# Patient Record
Sex: Female | Born: 1935 | Race: Black or African American | Hispanic: No | Smoking: Never smoker
Health system: Southern US, Community
[De-identification: ages and names within clinical notes are randomized; demographics above are authoritative.]

## PROBLEM LIST (undated history)

## (undated) DIAGNOSIS — I340 Nonrheumatic mitral (valve) insufficiency: Secondary | ICD-10-CM

## (undated) DIAGNOSIS — I071 Rheumatic tricuspid insufficiency: Secondary | ICD-10-CM

## (undated) DIAGNOSIS — I482 Chronic atrial fibrillation, unspecified: Secondary | ICD-10-CM

## (undated) DIAGNOSIS — I5032 Chronic diastolic (congestive) heart failure: Secondary | ICD-10-CM

## (undated) DIAGNOSIS — H269 Unspecified cataract: Secondary | ICD-10-CM

## (undated) DIAGNOSIS — R7611 Nonspecific reaction to tuberculin skin test without active tuberculosis: Secondary | ICD-10-CM

## (undated) DIAGNOSIS — M1990 Unspecified osteoarthritis, unspecified site: Secondary | ICD-10-CM

## (undated) DIAGNOSIS — I10 Essential (primary) hypertension: Secondary | ICD-10-CM

## (undated) HISTORY — PX: NO PAST SURGERIES: SHX2092

## (undated) HISTORY — PX: CATARACT EXTRACTION: SUR2

## (undated) HISTORY — DX: Rheumatic tricuspid insufficiency: I07.1

## (undated) HISTORY — DX: Nonrheumatic mitral (valve) insufficiency: I34.0

## (undated) HISTORY — DX: Chronic diastolic (congestive) heart failure: I50.32

## (undated) HISTORY — DX: Unspecified osteoarthritis, unspecified site: M19.90

## (undated) HISTORY — PX: EYE SURGERY: SHX253

---

## 2001-02-06 ENCOUNTER — Emergency Department (HOSPITAL_COMMUNITY): Admission: EM | Admit: 2001-02-06 | Discharge: 2001-02-06 | Payer: Self-pay

## 2004-03-16 ENCOUNTER — Ambulatory Visit: Payer: Self-pay | Admitting: Family Medicine

## 2004-03-16 ENCOUNTER — Ambulatory Visit: Payer: Self-pay | Admitting: *Deleted

## 2004-05-25 ENCOUNTER — Ambulatory Visit: Payer: Self-pay | Admitting: Family Medicine

## 2004-06-01 ENCOUNTER — Ambulatory Visit: Payer: Self-pay | Admitting: Family Medicine

## 2004-06-24 ENCOUNTER — Ambulatory Visit: Payer: Self-pay | Admitting: Family Medicine

## 2004-07-01 ENCOUNTER — Ambulatory Visit (HOSPITAL_COMMUNITY): Admission: RE | Admit: 2004-07-01 | Discharge: 2004-07-01 | Payer: Self-pay | Admitting: Family Medicine

## 2004-07-15 ENCOUNTER — Ambulatory Visit: Payer: Self-pay | Admitting: Family Medicine

## 2004-08-09 ENCOUNTER — Ambulatory Visit (HOSPITAL_COMMUNITY): Admission: RE | Admit: 2004-08-09 | Discharge: 2004-08-09 | Payer: Self-pay | Admitting: Family Medicine

## 2004-11-02 ENCOUNTER — Ambulatory Visit (HOSPITAL_COMMUNITY): Admission: RE | Admit: 2004-11-02 | Discharge: 2004-11-02 | Payer: Self-pay | Admitting: Family Medicine

## 2004-11-25 ENCOUNTER — Ambulatory Visit: Payer: Self-pay | Admitting: Family Medicine

## 2005-02-09 ENCOUNTER — Ambulatory Visit: Payer: Self-pay | Admitting: Family Medicine

## 2005-05-24 ENCOUNTER — Ambulatory Visit: Payer: Self-pay | Admitting: Family Medicine

## 2005-07-14 ENCOUNTER — Ambulatory Visit (HOSPITAL_COMMUNITY): Admission: RE | Admit: 2005-07-14 | Discharge: 2005-07-14 | Payer: Self-pay | Admitting: Family Medicine

## 2005-11-22 ENCOUNTER — Ambulatory Visit: Payer: Self-pay | Admitting: Family Medicine

## 2005-11-24 ENCOUNTER — Ambulatory Visit: Payer: Self-pay | Admitting: Family Medicine

## 2005-11-24 ENCOUNTER — Ambulatory Visit (HOSPITAL_COMMUNITY): Admission: RE | Admit: 2005-11-24 | Discharge: 2005-11-24 | Payer: Self-pay | Admitting: Internal Medicine

## 2006-03-21 ENCOUNTER — Ambulatory Visit: Payer: Self-pay | Admitting: Family Medicine

## 2007-01-07 ENCOUNTER — Encounter (INDEPENDENT_AMBULATORY_CARE_PROVIDER_SITE_OTHER): Payer: Self-pay | Admitting: Family Medicine

## 2007-01-07 ENCOUNTER — Other Ambulatory Visit: Admission: RE | Admit: 2007-01-07 | Discharge: 2007-01-07 | Payer: Self-pay | Admitting: Family Medicine

## 2007-01-07 ENCOUNTER — Ambulatory Visit: Payer: Self-pay | Admitting: Family Medicine

## 2007-01-08 ENCOUNTER — Encounter (INDEPENDENT_AMBULATORY_CARE_PROVIDER_SITE_OTHER): Payer: Self-pay | Admitting: Family Medicine

## 2007-01-08 LAB — CONVERTED CEMR LAB
Albumin: 4.1 g/dL (ref 3.5–5.2)
Alkaline Phosphatase: 98 units/L (ref 39–117)
BUN: 18 mg/dL (ref 6–23)
Calcium: 8.9 mg/dL (ref 8.4–10.5)
Cholesterol: 229 mg/dL — ABNORMAL HIGH (ref 0–200)
Eosinophils Absolute: 0.2 10*3/uL (ref 0.0–0.7)
Eosinophils Relative: 3 % (ref 0–5)
Glucose, Bld: 96 mg/dL (ref 70–99)
HCT: 40.9 % (ref 36.0–46.0)
Lymphs Abs: 3.1 10*3/uL (ref 0.7–3.3)
MCHC: 33.7 g/dL (ref 30.0–36.0)
MCV: 94.9 fL (ref 78.0–100.0)
Monocytes Relative: 7 % (ref 3–11)
Platelets: 304 10*3/uL (ref 150–400)
TSH: 3.633 microintl units/mL (ref 0.350–5.50)
Total Bilirubin: 0.6 mg/dL (ref 0.3–1.2)
Total Protein: 8.1 g/dL (ref 6.0–8.3)
WBC: 7.1 10*3/uL (ref 4.0–10.5)

## 2007-01-11 ENCOUNTER — Ambulatory Visit (HOSPITAL_COMMUNITY): Admission: RE | Admit: 2007-01-11 | Discharge: 2007-01-11 | Payer: Self-pay | Admitting: Family Medicine

## 2007-01-17 ENCOUNTER — Encounter: Admission: RE | Admit: 2007-01-17 | Discharge: 2007-01-17 | Payer: Self-pay | Admitting: Family Medicine

## 2007-01-22 DIAGNOSIS — M949 Disorder of cartilage, unspecified: Secondary | ICD-10-CM

## 2007-01-22 DIAGNOSIS — I10 Essential (primary) hypertension: Secondary | ICD-10-CM | POA: Insufficient documentation

## 2007-01-22 DIAGNOSIS — K649 Unspecified hemorrhoids: Secondary | ICD-10-CM | POA: Insufficient documentation

## 2007-01-22 DIAGNOSIS — M899 Disorder of bone, unspecified: Secondary | ICD-10-CM | POA: Insufficient documentation

## 2007-01-22 DIAGNOSIS — E785 Hyperlipidemia, unspecified: Secondary | ICD-10-CM

## 2007-04-17 ENCOUNTER — Ambulatory Visit: Payer: Self-pay | Admitting: Family Medicine

## 2007-05-28 ENCOUNTER — Ambulatory Visit: Payer: Self-pay | Admitting: Family Medicine

## 2007-05-28 LAB — CONVERTED CEMR LAB
ALT: 14 units/L (ref 0–35)
Albumin: 4.3 g/dL (ref 3.5–5.2)
CO2: 25 meq/L (ref 19–32)
Chloride: 100 meq/L (ref 96–112)
Cholesterol: 179 mg/dL (ref 0–200)
Glucose, Bld: 94 mg/dL (ref 70–99)
Potassium: 3.7 meq/L (ref 3.5–5.3)
Sodium: 140 meq/L (ref 135–145)
Total Bilirubin: 0.6 mg/dL (ref 0.3–1.2)
Total Protein: 8.4 g/dL — ABNORMAL HIGH (ref 6.0–8.3)
Triglycerides: 126 mg/dL (ref <150)
VLDL: 25 mg/dL (ref 0–40)

## 2007-11-18 ENCOUNTER — Ambulatory Visit: Payer: Self-pay | Admitting: Family Medicine

## 2007-11-18 DIAGNOSIS — M159 Polyosteoarthritis, unspecified: Secondary | ICD-10-CM

## 2008-05-04 ENCOUNTER — Ambulatory Visit: Payer: Self-pay | Admitting: Family Medicine

## 2008-05-08 ENCOUNTER — Ambulatory Visit (HOSPITAL_COMMUNITY): Admission: RE | Admit: 2008-05-08 | Discharge: 2008-05-08 | Payer: Self-pay | Admitting: Family Medicine

## 2008-06-08 ENCOUNTER — Ambulatory Visit: Payer: Self-pay | Admitting: Family Medicine

## 2008-06-08 DIAGNOSIS — R51 Headache: Secondary | ICD-10-CM | POA: Insufficient documentation

## 2008-06-08 DIAGNOSIS — R519 Headache, unspecified: Secondary | ICD-10-CM | POA: Insufficient documentation

## 2008-06-08 DIAGNOSIS — M79609 Pain in unspecified limb: Secondary | ICD-10-CM | POA: Insufficient documentation

## 2008-06-08 LAB — CONVERTED CEMR LAB
BUN: 17 mg/dL (ref 6–23)
Basophils Absolute: 0 10*3/uL (ref 0.0–0.1)
Basophils Relative: 1 % (ref 0–1)
CO2: 25 meq/L (ref 19–32)
Calcium: 9.5 mg/dL (ref 8.4–10.5)
Chloride: 95 meq/L — ABNORMAL LOW (ref 96–112)
Creatinine, Ser: 1.1 mg/dL (ref 0.40–1.20)
Eosinophils Relative: 1 % (ref 0–5)
HCT: 40.4 % (ref 36.0–46.0)
Hemoglobin: 13.9 g/dL (ref 12.0–15.0)
Lymphocytes Relative: 41 % (ref 12–46)
MCHC: 34.4 g/dL (ref 30.0–36.0)
Monocytes Absolute: 0.5 10*3/uL (ref 0.1–1.0)
Monocytes Relative: 8 % (ref 3–12)
Neutro Abs: 3.4 10*3/uL (ref 1.7–7.7)
RBC: 4.37 M/uL (ref 3.87–5.11)
RDW: 14 % (ref 11.5–15.5)
TSH: 3.013 microintl units/mL (ref 0.350–4.50)
Total Bilirubin: 0.6 mg/dL (ref 0.3–1.2)

## 2008-08-11 ENCOUNTER — Other Ambulatory Visit: Admission: RE | Admit: 2008-08-11 | Discharge: 2008-08-11 | Payer: Self-pay | Admitting: Family Medicine

## 2008-08-11 ENCOUNTER — Ambulatory Visit: Payer: Self-pay | Admitting: Family Medicine

## 2008-08-11 ENCOUNTER — Encounter (INDEPENDENT_AMBULATORY_CARE_PROVIDER_SITE_OTHER): Payer: Self-pay | Admitting: Family Medicine

## 2008-08-11 LAB — CONVERTED CEMR LAB
HDL: 63 mg/dL (ref >39)
Total CHOL/HDL Ratio: 2.8
Triglycerides: 81 mg/dL (ref <150)

## 2008-08-14 ENCOUNTER — Encounter (INDEPENDENT_AMBULATORY_CARE_PROVIDER_SITE_OTHER): Payer: Self-pay | Admitting: *Deleted

## 2008-08-23 ENCOUNTER — Encounter (INDEPENDENT_AMBULATORY_CARE_PROVIDER_SITE_OTHER): Payer: Self-pay | Admitting: Family Medicine

## 2008-08-23 ENCOUNTER — Telehealth (INDEPENDENT_AMBULATORY_CARE_PROVIDER_SITE_OTHER): Payer: Self-pay | Admitting: Family Medicine

## 2008-09-18 ENCOUNTER — Encounter (INDEPENDENT_AMBULATORY_CARE_PROVIDER_SITE_OTHER): Payer: Self-pay | Admitting: Family Medicine

## 2008-10-23 ENCOUNTER — Encounter: Admission: RE | Admit: 2008-10-23 | Discharge: 2008-10-23 | Payer: Self-pay | Admitting: General Practice

## 2008-11-27 ENCOUNTER — Telehealth (INDEPENDENT_AMBULATORY_CARE_PROVIDER_SITE_OTHER): Payer: Self-pay | Admitting: Internal Medicine

## 2009-07-23 ENCOUNTER — Telehealth: Payer: Self-pay | Admitting: Physician Assistant

## 2009-08-17 ENCOUNTER — Telehealth: Payer: Self-pay | Admitting: Physician Assistant

## 2010-07-05 NOTE — Progress Notes (Signed)
  Phone Note Outgoing Call   Summary of Call: Rec'd request for Diovan refill. Is this still our patient? Says she has [employer]. If still our patient, needs f/u appt. Please send in Rx if still our patient. . . if not, she needs her current PCP to refill.  Initial call taken by: Brynda Rim,  July 23, 2009 8:52 AM  Follow-up for Phone Call        number is disconnected.Marland KitchenMarland KitchenMarland KitchenArmenia Shannon  July 23, 2009 9:02 AM   number is disconnected.Marland KitchenMarland KitchenMarland KitchenArmenia Shannon  July 29, 2009 5:17 PM   number is disconnected...will mail letter and hold rx up front until we hear something from patien.Mikey College CMA  July 30, 2009 11:03 AM     Prescriptions: DIOVAN HCT 160-25 MG  TABS (VALSARTAN-HYDROCHLOROTHIAZIDE) 1PO qAM  #30 x 1   Entered and Authorized by:   Tereso Newcomer PA-C   Signed by:   Tereso Newcomer PA-C on 07/23/2009   Method used:   Printed then faxed to ...       [facility]  [address] [phone]* (retail)       [address]       [place], [place]  [address]       Ph: [phone]       Fax: [phone]   RxID:   [id number]

## 2010-07-05 NOTE — Progress Notes (Signed)
  Phone Note Outgoing Call   Summary of Call: Rec'd another refill request. See phone note from 07/23/2009.  Initial call taken by: Tereso Newcomer PA-C,  August 17, 2009 5:33 PM  Follow-up for Phone Call        number is disconnected.Marland KitchenMarland KitchenMarland KitchenArmenia Shannon  August 18, 2009 9:14 AM   called pharmacy and asked pharmacist to have pt to call office for the refill to see if she is still one of our pts Follow-up by: [place] Shannon,  August 18, 2009 9:19 AM

## 2011-03-23 ENCOUNTER — Ambulatory Visit (HOSPITAL_COMMUNITY)
Admission: RE | Admit: 2011-03-23 | Discharge: 2011-03-23 | Disposition: A | Discharge status: Home / Self Care | Payer: Medicare Other | Source: Ambulatory Visit | Attending: Ophthalmology | Admitting: Ophthalmology

## 2011-03-23 ENCOUNTER — Other Ambulatory Visit (HOSPITAL_COMMUNITY): Payer: Self-pay | Admitting: Ophthalmology

## 2011-03-23 ENCOUNTER — Encounter (HOSPITAL_COMMUNITY)
Admission: RE | Admit: 2011-03-23 | Discharge: 2011-03-23 | Disposition: A | Discharge status: Home / Self Care | Payer: Medicare Other | Source: Ambulatory Visit | Attending: Ophthalmology | Admitting: Ophthalmology

## 2011-03-23 DIAGNOSIS — H269 Unspecified cataract: Secondary | ICD-10-CM

## 2011-03-23 DIAGNOSIS — I498 Other specified cardiac arrhythmias: Secondary | ICD-10-CM | POA: Insufficient documentation

## 2011-03-23 DIAGNOSIS — Z01811 Encounter for preprocedural respiratory examination: Secondary | ICD-10-CM | POA: Insufficient documentation

## 2011-03-23 DIAGNOSIS — Z01812 Encounter for preprocedural laboratory examination: Secondary | ICD-10-CM | POA: Insufficient documentation

## 2011-03-23 LAB — CBC
HCT: 39.5 % (ref 36.0–46.0)
Hemoglobin: 13.7 g/dL (ref 12.0–15.0)
MCH: 32.5 pg (ref 26.0–34.0)
MCV: 93.6 fL (ref 78.0–100.0)
Platelets: 215 10*3/uL (ref 150–400)
RBC: 4.22 MIL/uL (ref 3.87–5.11)
WBC: 6.5 10*3/uL (ref 4.0–10.5)

## 2011-03-23 LAB — BASIC METABOLIC PANEL
CO2: 26 mEq/L (ref 19–32)
Calcium: 9.4 mg/dL (ref 8.4–10.5)
Chloride: 95 mEq/L — ABNORMAL LOW (ref 96–112)
Potassium: 4 mEq/L (ref 3.5–5.1)
Sodium: 134 mEq/L — ABNORMAL LOW (ref 135–145)

## 2011-03-23 LAB — APTT: aPTT: 37 seconds (ref 24–37)

## 2011-03-29 ENCOUNTER — Ambulatory Visit (HOSPITAL_COMMUNITY)
Admission: RE | Admit: 2011-03-29 | Discharge: 2011-03-29 | Disposition: A | Discharge status: Home / Self Care | Payer: Medicare Other | Source: Ambulatory Visit | Attending: Ophthalmology | Admitting: Ophthalmology

## 2011-03-29 DIAGNOSIS — I509 Heart failure, unspecified: Secondary | ICD-10-CM | POA: Insufficient documentation

## 2011-03-29 DIAGNOSIS — H269 Unspecified cataract: Secondary | ICD-10-CM | POA: Insufficient documentation

## 2011-03-29 DIAGNOSIS — Z01818 Encounter for other preprocedural examination: Secondary | ICD-10-CM | POA: Insufficient documentation

## 2011-03-29 DIAGNOSIS — R062 Wheezing: Secondary | ICD-10-CM | POA: Insufficient documentation

## 2011-03-29 DIAGNOSIS — Z5309 Procedure and treatment not carried out because of other contraindication: Secondary | ICD-10-CM | POA: Insufficient documentation

## 2011-03-31 ENCOUNTER — Ambulatory Visit
Admission: RE | Admit: 2011-03-31 | Discharge: 2011-03-31 | Disposition: A | Discharge status: Home / Self Care | Payer: Medicare Other | Source: Ambulatory Visit | Attending: Cardiovascular Disease | Admitting: Cardiovascular Disease

## 2011-03-31 ENCOUNTER — Other Ambulatory Visit: Payer: Self-pay | Admitting: Cardiovascular Disease

## 2011-03-31 DIAGNOSIS — R0602 Shortness of breath: Secondary | ICD-10-CM

## 2012-03-27 ENCOUNTER — Ambulatory Visit
Admission: RE | Admit: 2012-03-27 | Discharge: 2012-03-27 | Disposition: A | Discharge status: Home / Self Care | Payer: Medicare Other | Source: Ambulatory Visit | Attending: Cardiovascular Disease | Admitting: Cardiovascular Disease

## 2012-03-27 ENCOUNTER — Other Ambulatory Visit: Payer: Self-pay | Admitting: Cardiovascular Disease

## 2012-03-27 DIAGNOSIS — R0602 Shortness of breath: Secondary | ICD-10-CM

## 2012-08-08 ENCOUNTER — Encounter (HOSPITAL_COMMUNITY): Payer: Self-pay

## 2012-08-13 ENCOUNTER — Encounter (HOSPITAL_COMMUNITY)
Admission: RE | Admit: 2012-08-13 | Discharge: 2012-08-13 | Disposition: A | Discharge status: Home / Self Care | Payer: Medicare Other | Source: Ambulatory Visit | Attending: Ophthalmology | Admitting: Ophthalmology

## 2012-08-13 ENCOUNTER — Encounter (HOSPITAL_COMMUNITY): Payer: Self-pay

## 2012-08-13 ENCOUNTER — Other Ambulatory Visit: Payer: Self-pay | Admitting: Ophthalmology

## 2012-08-13 HISTORY — DX: Essential (primary) hypertension: I10

## 2012-08-13 HISTORY — DX: Chronic atrial fibrillation, unspecified: I48.20

## 2012-08-13 HISTORY — DX: Unspecified cataract: H26.9

## 2012-08-13 HISTORY — DX: Nonspecific reaction to tuberculin skin test without active tuberculosis: R76.11

## 2012-08-13 LAB — BASIC METABOLIC PANEL
CO2: 24 mEq/L (ref 19–32)
GFR calc non Af Amer: 33 mL/min — ABNORMAL LOW (ref >90)
Glucose, Bld: 107 mg/dL — ABNORMAL HIGH (ref 70–99)
Potassium: 4.4 mEq/L (ref 3.5–5.1)
Sodium: 135 mEq/L (ref 135–145)

## 2012-08-13 LAB — APTT: aPTT: 38 seconds — ABNORMAL HIGH (ref 24–37)

## 2012-08-13 LAB — CBC
Hemoglobin: 14.7 g/dL (ref 12.0–15.0)
MCH: 34.1 pg — ABNORMAL HIGH (ref 26.0–34.0)
MCV: 94.4 fL (ref 78.0–100.0)
RBC: 4.31 MIL/uL (ref 3.87–5.11)
WBC: 8.9 10*3/uL (ref 4.0–10.5)

## 2012-08-13 LAB — PROTIME-INR: Prothrombin Time: 24.1 seconds — ABNORMAL HIGH (ref 11.6–15.2)

## 2012-08-13 MED ORDER — TETRACAINE HCL 0.5 % OP SOLN: 1.0000 [drp] | OPHTHALMIC | Status: DC

## 2012-08-13 NOTE — Pre-Procedure Instructions (Signed)
Kaytelynn Scripter  08/13/2012   Your procedure is scheduled on:  Wednesday August 14, 2012  Report to [facility] at 8:30 AM.  Call this number if you have problems the morning of surgery: [phone]   Remember:   Do not eat food or drink liquids after midnight.   Take these medicines the morning of surgery with A SIP OF WATER: tylenol (if needed), atenolol, tobradex, tekturna   Do not wear jewelry, make-up or nail polish.  Do not wear lotions, powders, or perfumes.  Do not shave 48 hours prior to surgery.  Do not bring valuables to the hospital.  Contacts, dentures or bridgework may not be worn into surgery.  Leave suitcase in the car. After surgery it may be brought to your room.  For patients admitted to the hospital, checkout time is 11:00 AM the day of  discharge.   Patients discharged the day of surgery will not be allowed to drive  home.  Name and phone number of your driver: family / friend  Special Instructions: Shower using CHG 2 nights before surgery and the night before surgery.  If you shower the day of surgery use CHG.  Use special wash - you have one bottle of CHG for all showers.  You should use approximately 1/3 of the bottle for each shower.   Please read over the following fact sheets that you were given: Pain Booklet, Coughing and Deep Breathing and Surgical Site Infection Prevention

## 2012-08-13 NOTE — Progress Notes (Signed)
Records received from [facility]; Pt with chronic AFIB and diastolic heart failure last seen 04/2012. Patents daughter states that Dr Harlon Flor told her not to stop Coumadin for surgery.Dr Harlon Flor not in office . Left message with receptionist to put orders in epic.

## 2012-08-13 NOTE — H&P (Signed)
History & Physical:   DATE:   08-01-2012  NAME:  Sherry Peck, Sherry Peck      [id number]       HISTORY OF PRESENT ILLNESS: Chief Eye Complaints blurry vision   Pre-op  phaco emulsion cataract extraction w/IOL OD.  patient  states she is ready for the surgery OD      HPI: EYES: Reports symptoms of vision disturbances.    can't read and trouble seeing to walk.     LOCATION:      QUALITY/COURSE:   Reports condition is worsening.        INTENSITY/SEVERITY:    Reports measurement ( or degree) as  severe   DURATION:   Reports the general length of symptoms to be months.      ONSET/TIMING:   Reports occurrence as 5 months ago.      CONTEXT/WHEN:   Reports usually associated with   MODIFIERS/TREATMENTS:  Improved by              ROS:   GEN- Constitutional: HENT: GEN - Endocrine: Reports symptoms of LUNGS/Respiratory:        HEART/Cardiovascular: Reports symptoms of hypertension.        A-Fib, ABD/Gastrointestinal:   Musculoskeletal (BJE):arthritis NEURO/Neurological:     PSYCH/Psychiatric:    Is the pt oriented to time, place, person?  Mood  normal    ACTIVE PROBLEMS: Mature senile cataract   ICD#366.17  OD  SURGERIES: AFTER CATARACT LASER SURGERY.    #09811  Related Dxs-  Modifiers-   YAG OS 03-28-12  Cataract removal,  OS Pick List - Surgeries  MEDICATIONS: "Klor-Con (Potassium) ER Tabs:    10 mEq (tablet, extended release)    SIG-   1 each      once a day               Tekturna (aliskiren):   150 mg (tablet) SIG-  1 tab(s)  once a day    Diltiazem (Cardizem, Dilacor, Tiazac):   120 mg (tablet)  SIG-  1 each    2 times a day     Coumadin (Warfarin):   3 mg (tablet)  SIG-   as directed   once a day    Furosemide (Lasix):   20 mg (tablet)  SIG-  1 each    in the AM     Atenolol (Tenormin):   25 mg (tablet)  SIG-  1 each   once a day  REVIEW OF SYSTEMS: not found  TOBACCO: Never smoker   ICD#V13.8   Pick List - Tobacco - Summary    Smoker Status:     Tobacco use:     Tobacco cessation:  SOCIAL HISTORY: Herbalist List - Social History  FAMILY HISTORY: Positive family history for  -   HTN UNCLES/AUNT  ALLERGIES: Drug Allergies:     No known.    Starter - Allergies - Summary:  PHYSICAL EXAMINATION: Exam: GENERAL: Appearance: General appearance can be described as well-nourished, well-developed, and in no acute distress.         [place]    OD [place] 20/  CF @ 2 1/2'    unable to undeerstand    cc 20/          OS [place] 20/    200     cc 20/  EYEGLASSES: OD  Didn't Bring  OS                ADD:  MR  OD    +-  NI     CF        OS    +- NI   20/200 pictures ADD  VF:  OD                                               OS  PUPILS: 3 mm reactive each eye  EYELIDS & OCULAR ADNEXA nevi each eye  SLE: Conjunctiva heavy pigmentation  Cornea arcus with decreased tear film each eye   anterior chamber  deep and quiet each eye  Iris Brown each eye with iris atrophy OS  Lens +3-4 nuclear and cortical opacity white cataract OD, posterior chamber intraocular lens implant OS with posterior capsule fibrosis  Vitreous  CCT  Ta   in mmHg  18  OD       18     OS Time 5:20 PM  Gonio   Dilation tropicamide 1% phenylephrine 2.5%  Fundus: poor view OU  optic nerve  OD                                                                     OS   Macula      OD                                                    OS   Vessels  Periphery  H&P  B/P: 130/62 Resp:20 Pulse:58   Exam: GENERAL: Appearance: General appearance can be described as well-nourished, well-developed, and in no acute distress.    LYMPHATIC: HEAD, EARS, NOSE AND THROAT: Ears-Nose (external) Inspection: Externally, nose and ears are normal in appearance and without scars, lesions, or nodules.      Otoscopic Exam: External auditory canals and tympanic membranes are normal.      Hearing assessment shows  no problems with normal conversation.    Nose exam, internally, reveals nasal mucosa, septum and turbinates are unremarkable.    Teeth, Gingiva, and Lip Exams: No lesions or evidence of infection.      Oropharynx demonstrates oral mucosa, salivary glands, tongue, tonsils, posterior pharynx, hard-soft palates are normal.  EYES: see above  NECK: Neck tissue exam demonstrates no masses, symmetrical, and trachea is midline.      LUNGS and RESPIRATORY: Lung auscultation elicits no wheezing, rhonci, rales or rubs and with equal breath sounds.    Respiratory effort described as breathing is unlabored and chest movement is symmetrical.    HEART (Cardiovascular): Heart auscultation discovers regular rate and rhythm; no murmur, gallop or rub. Normal heart sounds, murmur, gallop.    ABDOMEN (Gastrointestinal): Mass/Tenderness Exam: Neither are present.     Liver/Spleen: No hepatomegaly or splenomegaly.   MUSCULOSKELETAL (BJE): Inspection-Palpation: No major bone, joint, tendon, or muscle changes.      NEUROLOGICAL: Alert and oriented. No major deficits of  coordination or sensation.      PSYCHIATRIC: Insight and judgment appear  both to be intact and appropriate.    Mood and affect are described as normal mood and full affect.    SKIN: Skin Inspection: No rashes or lesions.  ADMITTING DIAGNOSIS: Mature senile cataract   ICD#366.17  OD COMPLEX WILL REQUIRE  TRYPAN BLUE STAIN ANTERIOR  CAPSULE  Risk and benefits of surgery have been reviewed with the patient and the patient agrees to proceed with the surgical procedure.  SURGICAL TREATMENT PLAN: ECCE OR  phaco emulsion cataract extraction  WITH  intraocular lens implant  OD   Actions:     Handouts: Calcium Channel Blockers, Coumadin, Erythromycins, Furosemide, Insect Bites/stings, Potassium, Beta-Blockers.    ___________________________ Chalmers Guest, Montez Hageman Starter - Inactive Problems:

## 2012-08-14 ENCOUNTER — Encounter (HOSPITAL_COMMUNITY): Payer: Self-pay | Admitting: *Deleted

## 2012-08-14 ENCOUNTER — Ambulatory Visit (HOSPITAL_COMMUNITY): Admission: RE | Admit: 2012-08-14 | Payer: Medicare Other | Source: Ambulatory Visit | Admitting: Ophthalmology

## 2012-08-14 ENCOUNTER — Encounter (HOSPITAL_COMMUNITY): Payer: Self-pay | Admitting: Anesthesiology

## 2012-08-14 ENCOUNTER — Ambulatory Visit (HOSPITAL_COMMUNITY)
Admission: RE | Admit: 2012-08-14 | Discharge: 2012-08-14 | Disposition: A | Discharge status: Home / Self Care | Payer: Medicare Other | Source: Ambulatory Visit | Attending: Ophthalmology | Admitting: Ophthalmology

## 2012-08-14 ENCOUNTER — Encounter (HOSPITAL_COMMUNITY): Admission: RE | Payer: Self-pay | Source: Ambulatory Visit

## 2012-08-14 ENCOUNTER — Ambulatory Visit (HOSPITAL_COMMUNITY): Payer: Medicare Other | Admitting: Anesthesiology

## 2012-08-14 ENCOUNTER — Encounter (HOSPITAL_COMMUNITY)
Admission: RE | Disposition: A | Discharge status: Home / Self Care | Payer: Self-pay | Source: Ambulatory Visit | Attending: Ophthalmology

## 2012-08-14 DIAGNOSIS — M129 Arthropathy, unspecified: Secondary | ICD-10-CM | POA: Insufficient documentation

## 2012-08-14 DIAGNOSIS — I10 Essential (primary) hypertension: Secondary | ICD-10-CM | POA: Insufficient documentation

## 2012-08-14 DIAGNOSIS — I4891 Unspecified atrial fibrillation: Secondary | ICD-10-CM | POA: Insufficient documentation

## 2012-08-14 DIAGNOSIS — IMO0002 Reserved for concepts with insufficient information to code with codable children: Secondary | ICD-10-CM | POA: Insufficient documentation

## 2012-08-14 HISTORY — PX: CATARACT EXTRACTION W/PHACO: SHX586

## 2012-08-14 SURGERY — PHACOEMULSIFICATION, CATARACT, WITH IOL INSERTION
Anesthesia: Monitor Anesthesia Care | Site: Eye | Laterality: Right | Wound class: Clean

## 2012-08-14 SURGERY — PHACOEMULSIFICATION, CATARACT, WITH IOL INSERTION
Anesthesia: Monitor Anesthesia Care | Laterality: Right

## 2012-08-14 MED ORDER — TOBRAMYCIN 0.3 % OP OINT
TOPICAL_OINTMENT | OPHTHALMIC | Status: DC | PRN
  Administered 2012-08-14: 1 via OPHTHALMIC

## 2012-08-14 MED ORDER — DEXAMETHASONE SODIUM PHOSPHATE 10 MG/ML IJ SOLN
INTRAMUSCULAR | Status: AC
  Filled 2012-08-14: qty 1

## 2012-08-14 MED ORDER — HYDROMORPHONE HCL PF 1 MG/ML IJ SOLN: 0.2500 mg | INTRAMUSCULAR | Status: DC | PRN

## 2012-08-14 MED ORDER — TOBRAMYCIN-DEXAMETHASONE 0.3-0.1 % OP OINT
TOPICAL_OINTMENT | OPHTHALMIC | Status: AC
  Filled 2012-08-14: qty 3.5

## 2012-08-14 MED ORDER — PROPOFOL 10 MG/ML IV BOLUS
INTRAVENOUS | Status: DC | PRN
  Administered 2012-08-14: 40 mg via INTRAVENOUS

## 2012-08-14 MED ORDER — GATIFLOXACIN 0.5 % OP SOLN
1.0000 [drp] | OPHTHALMIC | Status: AC
  Administered 2012-08-14 (×3): 1 [drp] via OPHTHALMIC

## 2012-08-14 MED ORDER — NA CHONDROIT SULF-NA HYALURON 40-30 MG/ML IO SOLN
INTRAOCULAR | Status: DC | PRN
  Administered 2012-08-14 (×2): 0.5 mL via INTRAOCULAR

## 2012-08-14 MED ORDER — ACETYLCHOLINE CHLORIDE 1:100 IO SOLR
INTRAOCULAR | Status: AC
  Filled 2012-08-14: qty 1

## 2012-08-14 MED ORDER — GENTAMICIN SULFATE 40 MG/ML IJ SOLN
INTRAMUSCULAR | Status: AC
  Filled 2012-08-14: qty 2

## 2012-08-14 MED ORDER — TRYPAN BLUE 0.15 % OP SOLN
OPHTHALMIC | Status: DC | PRN
  Administered 2012-08-14: 0.5 mL via INTRAVITREAL

## 2012-08-14 MED ORDER — TROPICAMIDE 1 % OP SOLN: 1.0000 [drp] | OPHTHALMIC | Status: DC

## 2012-08-14 MED ORDER — LIDOCAINE HCL 2 % IJ SOLN
INTRAMUSCULAR | Status: DC | PRN
  Administered 2012-08-14: 10 mL

## 2012-08-14 MED ORDER — BSS IO SOLN
INTRAOCULAR | Status: DC | PRN
  Administered 2012-08-14: 15 mL via INTRAOCULAR

## 2012-08-14 MED ORDER — KETOROLAC TROMETHAMINE 0.5 % OP SOLN
1.0000 [drp] | OPHTHALMIC | Status: DC
  Filled 2012-08-14: qty 5

## 2012-08-14 MED ORDER — KETOROLAC TROMETHAMINE 0.5 % OP SOLN
1.0000 [drp] | OPHTHALMIC | Status: AC
  Administered 2012-08-14: 1 [drp] via OPHTHALMIC

## 2012-08-14 MED ORDER — LIDOCAINE-EPINEPHRINE 2 %-1:100000 IJ SOLN
INTRAMUSCULAR | Status: AC
  Filled 2012-08-14: qty 1

## 2012-08-14 MED ORDER — GATIFLOXACIN 0.5 % OP SOLN
1.0000 [drp] | OPHTHALMIC | Status: DC
  Filled 2012-08-14: qty 2.5

## 2012-08-14 MED ORDER — TROPICAMIDE 1 % OP SOLN
1.0000 [drp] | OPHTHALMIC | Status: DC
  Administered 2012-08-14: 1 [drp] via OPHTHALMIC
  Filled 2012-08-14: qty 3

## 2012-08-14 MED ORDER — BUPIVACAINE HCL (PF) 0.75 % IJ SOLN
INTRAMUSCULAR | Status: AC
  Filled 2012-08-14: qty 10

## 2012-08-14 MED ORDER — SODIUM HYALURONATE 10 MG/ML IO SOLN
INTRAOCULAR | Status: AC
  Filled 2012-08-14: qty 0.85

## 2012-08-14 MED ORDER — TRYPAN BLUE 0.15 % OP SOLN
0.5000 mL | OPHTHALMIC | Status: DC
  Filled 2012-08-14: qty 0.5

## 2012-08-14 MED ORDER — CYCLOPENTOLATE HCL 1 % OP SOLN
1.0000 [drp] | OPHTHALMIC | Status: DC
  Administered 2012-08-14: 1 [drp] via OPHTHALMIC
  Filled 2012-08-14: qty 2

## 2012-08-14 MED ORDER — TETRACAINE HCL 0.5 % OP SOLN
OPHTHALMIC | Status: AC
  Filled 2012-08-14: qty 2

## 2012-08-14 MED ORDER — LIDOCAINE HCL 2 % IJ SOLN
INTRAMUSCULAR | Status: AC
  Filled 2012-08-14: qty 20

## 2012-08-14 MED ORDER — BUPIVACAINE-EPINEPHRINE 0.25% -1:200000 IJ SOLN
INTRAMUSCULAR | Status: AC
  Filled 2012-08-14: qty 1

## 2012-08-14 MED ORDER — BSS IO SOLN
INTRAOCULAR | Status: AC
  Filled 2012-08-14: qty 500

## 2012-08-14 MED ORDER — TETRACAINE HCL 0.5 % OP SOLN
OPHTHALMIC | Status: DC | PRN
  Administered 2012-08-14: 4 [drp] via OPHTHALMIC

## 2012-08-14 MED ORDER — LIDOCAINE HCL (CARDIAC) 20 MG/ML IV SOLN
INTRAVENOUS | Status: DC | PRN
  Administered 2012-08-14: 60 mg via INTRAVENOUS

## 2012-08-14 MED ORDER — EPINEPHRINE HCL 1 MG/ML IJ SOLN
INTRAMUSCULAR | Status: AC
  Filled 2012-08-14: qty 1

## 2012-08-14 MED ORDER — ACETYLCHOLINE CHLORIDE 1:100 IO SOLR
INTRAOCULAR | Status: DC | PRN
  Administered 2012-08-14: 20 mg via INTRAOCULAR

## 2012-08-14 MED ORDER — PHENYLEPHRINE HCL 2.5 % OP SOLN: 1.0000 [drp] | OPHTHALMIC | Status: DC

## 2012-08-14 MED ORDER — BUPIVACAINE HCL (PF) 0.75 % IJ SOLN
INTRAMUSCULAR | Status: DC | PRN
  Administered 2012-08-14: 10 mL

## 2012-08-14 MED ORDER — PROVISC 10 MG/ML IO SOLN
INTRAOCULAR | Status: DC | PRN
  Administered 2012-08-14: 8.5 mg via INTRAOCULAR

## 2012-08-14 MED ORDER — FENTANYL CITRATE 0.05 MG/ML IJ SOLN
INTRAMUSCULAR | Status: DC | PRN
  Administered 2012-08-14: 50 ug via INTRAVENOUS

## 2012-08-14 MED ORDER — ONDANSETRON HCL 4 MG/2ML IJ SOLN: 4.0000 mg | Freq: Once | INTRAMUSCULAR | Status: DC | PRN

## 2012-08-14 MED ORDER — PILOCARPINE HCL 4 % OP SOLN
OPHTHALMIC | Status: AC
  Filled 2012-08-14: qty 15

## 2012-08-14 MED ORDER — SODIUM CHLORIDE 0.9 % IV SOLN
INTRAVENOUS | Status: DC
  Administered 2012-08-14: 10:00:00 via INTRAVENOUS

## 2012-08-14 MED ORDER — PHENYLEPHRINE HCL 2.5 % OP SOLN
1.0000 [drp] | OPHTHALMIC | Status: DC
  Administered 2012-08-14: 1 [drp] via OPHTHALMIC
  Filled 2012-08-14: qty 3

## 2012-08-14 MED ORDER — EPINEPHRINE HCL 1 MG/ML IJ SOLN
INTRAOCULAR | Status: DC | PRN
  Administered 2012-08-14: 12:00:00

## 2012-08-14 MED ORDER — BSS IO SOLN
INTRAOCULAR | Status: AC
  Filled 2012-08-14: qty 15

## 2012-08-14 SURGICAL SUPPLY — 42 items
APPLICATOR COTTON TIP 6IN STRL (MISCELLANEOUS) ×2 IMPLANT
APPLICATOR DR MATTHEWS STRL (MISCELLANEOUS) ×2 IMPLANT
BLADE KERATOME 2.75 (BLADE) ×2 IMPLANT
BLADE MINI RND TIP GREEN BEAV (BLADE) IMPLANT
BLADE STAB KNIFE 45DEG (BLADE) IMPLANT
CANNULA ANTERIOR CHAMBER 27GA (MISCELLANEOUS) ×2 IMPLANT
CLOTH BEACON ORANGE TIMEOUT ST (SAFETY) ×2 IMPLANT
CORDS BIPOLAR (ELECTRODE) ×2 IMPLANT
DRAPE OPHTHALMIC 40X48 W POUCH (DRAPES) ×2 IMPLANT
DRAPE RETRACTOR (MISCELLANEOUS) ×2 IMPLANT
ERASER HMR WETFIELD 23G BP (MISCELLANEOUS) ×2 IMPLANT
FILTER BLUE MILLIPORE (MISCELLANEOUS) IMPLANT
GLOVE BIO SURGEON STRL SZ8 (GLOVE) ×2 IMPLANT
GLOVE BIOGEL M STRL SZ7.5 (GLOVE) ×2 IMPLANT
GOWN STRL NON-REIN LRG LVL3 (GOWN DISPOSABLE) ×4 IMPLANT
KIT BASIN OR (CUSTOM PROCEDURE TRAY) ×2 IMPLANT
KIT ROOM TURNOVER OR (KITS) ×2 IMPLANT
KNIFE CRESCENT 2.5 55 ANG (BLADE) ×2 IMPLANT
LENS IOL ACRSF IQ PC 24.5 (Intraocular Lens) ×1 IMPLANT
LENS IOL ACRYSOF IQ POST 24.5 (Intraocular Lens) ×2 IMPLANT
MASK EYE SHIELD (GAUZE/BANDAGES/DRESSINGS) ×2 IMPLANT
NEEDLE 18GX1X1/2 (RX/OR ONLY) (NEEDLE) ×2 IMPLANT
NEEDLE 25GX 5/8IN NON SAFETY (NEEDLE) ×2 IMPLANT
NEEDLE FILTER BLUNT 18X 1/2SAF (NEEDLE) ×1
NEEDLE FILTER BLUNT 18X1 1/2 (NEEDLE) ×1 IMPLANT
NS IRRIG 1000ML POUR BTL (IV SOLUTION) ×2 IMPLANT
PACK CATARACT CUSTOM (CUSTOM PROCEDURE TRAY) ×2 IMPLANT
PACK CATARACT MCHSCP (PACKS) ×2 IMPLANT
PAD ARMBOARD 7.5X6 YLW CONV (MISCELLANEOUS) ×4 IMPLANT
PAD EYE OVAL STERILE LF (GAUZE/BANDAGES/DRESSINGS) ×2 IMPLANT
PROBE ANTERIOR 20G W/INFUS NDL (MISCELLANEOUS) IMPLANT
SPEAR EYE SURG WECK-CEL (MISCELLANEOUS) ×4 IMPLANT
SUT ETHILON 10 0 CS140 6 (SUTURE) ×2 IMPLANT
SUT SILK 4 0 C 3 735G (SUTURE) IMPLANT
SUT SILK 6 0 G 6 (SUTURE) IMPLANT
SUT VICRYL 8 0 TG140 8 (SUTURE) IMPLANT
SYR 3ML LL SCALE MARK (SYRINGE) IMPLANT
SYR TB 1ML LUER SLIP (SYRINGE) ×2 IMPLANT
TAPE SURG TRANSPORE 1 IN (GAUZE/BANDAGES/DRESSINGS) ×1 IMPLANT
TAPE SURGICAL TRANSPORE 1 IN (GAUZE/BANDAGES/DRESSINGS) ×1
TOWEL OR 17X24 6PK STRL BLUE (TOWEL DISPOSABLE) ×4 IMPLANT
WATER STERILE IRR 1000ML POUR (IV SOLUTION) ×2 IMPLANT

## 2012-08-14 NOTE — Op Note (Signed)
Operative diagnosis mature complex cataract right eye Postoperative diagnosis: Same Procedure phacoemulsification with intraocular lens implant using trypan blue additional device because of the complex white cataract Anesthesia: 2% Xylocaine with epinephrine in a 50-50 mixture 0.75% Marcaine with ample Wydase Consultations: None Assistant: Mindy Procedure: The patient was given a peribulbar block with the aforementioned local anesthetic agent following this the patient's face was prepped and draped in the usual sterile fashion with a surgeon sitting at 12:00 the operating microscope was put into position and a lid speculum was inserted it was noted that the cataract was densely white making visualization of the anterior capsule and possible with out using trypan blue. Following this a Weck-Cel sponges used to fixate the globe and a stab incision was made at the into the anterior chamber was made at the 1:00 position following this using a Hoskins a 0.12 forceps the conjunctiva incision was made at the 11:00 position and was the conjunctiva was recessed forming a fornix-based conjunctival flap bleeding was controlled with cautery. All in this the 2.4 mm keratome blade was used in a stepwise fashion after creating a scleral flap and then the blade into the anterior chamber. At this point the trypan blue was then injected to stain the anterior capsule to improve visualization. Following this Viscoat was injected into the eye the Tytan blue was irrigated out using balanced salt solution. Following this a bent 25-gauge needle was used to incise anterior capsule and a continuous tear curvilinear capsulorrhexis was formed the Utrata forceps was used to remove the anterior capsule BSS was used to hydrodissect the nucleus the nucleus was slightly rotated in the capsular bag at this point the phacoemulsification unit was then used to sculpt and sculpt the very very dense nucleus 4 quadrants were sculpted and then the  nucleus was divided into 3 quadrants to quadrants were removed and the final fragment was Viscoat elevated and Viscoat separated with Viscoat and then removed with the phacoemulsification unit at this point the I/A was then used to remove a small remaining epinuclear flap there was almost no cortex in this mature cataract. Following this the intraocular lens implant was taken from the package and examined and noted to have no defects it was an Alcon AcrySof SN 60 WF 24.5 diopter lens SN #[id number] the lens is placed in the lens injector and after Provisc had been injected in the I/A was injected in position with a Kuglen hook beneath the anterior capsular leaf leaf the irrigation aspiration device was then used to remove Viscoat from the eye the eye was pressurized and there was no leakage at the incision the paracentesis tract was hydrated there being no leakage mock stat was injected in the eye the pupil was noted to come down round and symmetrical. All instruments removed from the eye topical TobraDex was applied TobraDex ointment a patch and Fox U. were placed and the patient returned to recovery area in stable condition. Complications: None Chalmers Guest Junior M.D.

## 2012-08-14 NOTE — Anesthesia Postprocedure Evaluation (Signed)
  Anesthesia Post-op Note  Patient: Sherry Peck  Procedure(s) Performed: Procedure(s): CATARACT EXTRACTION PHACO AND INTRAOCULAR LENS PLACEMENT (IOC) (Right)  Patient Location: PACU  Anesthesia Type:MAC  Level of Consciousness: awake, oriented and patient cooperative  Airway and Oxygen Therapy: Patient Spontanous Breathing  Post-op Pain: none  Post-op Assessment: Post-op Vital signs reviewed, Patient's Cardiovascular Status Stable, Respiratory Function Stable, Patent Airway, No signs of Nausea or vomiting and Pain level controlled  Post-op Vital Signs: stable  Complications: No apparent anesthesia complications

## 2012-08-14 NOTE — Progress Notes (Signed)
Report given to MArk RN

## 2012-08-14 NOTE — Anesthesia Preprocedure Evaluation (Signed)
Anesthesia Evaluation  Patient identified by MRN, date of birth, ID band Patient unresponsive    Reviewed: Allergy & Precautions, H&P , NPO status , Patient's Chart, lab work & pertinent test results  Airway Mallampati: I TM Distance: >3 FB Neck ROM: full    Dental   Pulmonary          Cardiovascular hypertension, +CHF Rhythm:regular Rate:Normal     Neuro/Psych  Headaches,    GI/Hepatic   Endo/Other    Renal/GU      Musculoskeletal   Abdominal   Peds  Hematology   Anesthesia Other Findings   Reproductive/Obstetrics                           Anesthesia Physical Anesthesia Plan  ASA: III  Anesthesia Plan: MAC and General   Post-op Pain Management:    Induction: Intravenous  Airway Management Planned: Mask  Additional Equipment:   Intra-op Plan:   Post-operative Plan:   Informed Consent: I have reviewed the patients History and Physical, chart, labs and discussed the procedure including the risks, benefits and alternatives for the proposed anesthesia with the patient or authorized representative who has indicated his/her understanding and acceptance.     Plan Discussed with: CRNA, Anesthesiologist and Surgeon  Anesthesia Plan Comments:         Anesthesia Quick Evaluation

## 2012-08-14 NOTE — H&P (View-Only) (Signed)
                  History & Physical:   DATE:   08-01-2012  NAME:  Sherry Peck, Sherry Peck      [id number]       HISTORY OF PRESENT ILLNESS: Chief Eye Complaints blurry vision   Pre-op  phaco emulsion cataract extraction w/IOL OD.  patient  states she is ready for the surgery OD      HPI: EYES: Reports symptoms of vision disturbances.    can't read and trouble seeing to walk.     LOCATION:      QUALITY/COURSE:   Reports condition is worsening.        INTENSITY/SEVERITY:    Reports measurement ( or degree) as  severe   DURATION:   Reports the general length of symptoms to be months.      ONSET/TIMING:   Reports occurrence as 5 months ago.      CONTEXT/WHEN:   Reports usually associated with   MODIFIERS/TREATMENTS:  Improved by              ROS:   GEN- Constitutional: HENT: GEN - Endocrine: Reports symptoms of LUNGS/Respiratory:        HEART/Cardiovascular: Reports symptoms of hypertension.        A-Fib, ABD/Gastrointestinal:   Musculoskeletal (BJE):arthritis NEURO/Neurological:     PSYCH/Psychiatric:    Is the pt oriented to time, place, person?  Mood  normal    ACTIVE PROBLEMS: Mature senile cataract   ICD#366.17  OD  SURGERIES: AFTER CATARACT LASER SURGERY.    #66821  Related Dxs-  Modifiers-   YAG OS 03-28-12  Cataract removal,  OS Pick List - Surgeries  MEDICATIONS: "Klor-Con (Potassium) ER Tabs:    10 mEq (tablet, extended release)    SIG-   1 each      once a day               Tekturna (aliskiren):   150 mg (tablet) SIG-  1 tab(s)  once a day    Diltiazem (Cardizem, Dilacor, Tiazac):   120 mg (tablet)  SIG-  1 each    2 times a day     Coumadin (Warfarin):   3 mg (tablet)  SIG-   as directed   once a day    Furosemide (Lasix):   20 mg (tablet)  SIG-  1 each    in the AM     Atenolol (Tenormin):   25 mg (tablet)  SIG-  1 each   once a day  REVIEW OF SYSTEMS: not found  TOBACCO: Never smoker   ICD#V13.8   Pick List - Tobacco - Summary    Smoker Status:     Tobacco use:     Tobacco cessation:  SOCIAL HISTORY: Starter Pick List - Social History  FAMILY HISTORY: Positive family history for  -   HTN UNCLES/AUNT  ALLERGIES: Drug Allergies:     No known.    Starter - Allergies - Summary:  PHYSICAL EXAMINATION: Exam: GENERAL: Appearance: General appearance can be described as well-nourished, well-developed, and in no acute distress.         [place]    OD [place] 20/  CF @ 2 1/2'    unable to undeerstand    cc 20/          OS [place] 20/    200     cc 20/  EYEGLASSES: OD  Didn't Bring                                                                      OS                ADD:  MR  OD    +-  NI     CF        OS    +- NI   20/200 pictures ADD  VF:  OD                                               OS  PUPILS: 3 mm reactive each eye  EYELIDS & OCULAR ADNEXA nevi each eye  SLE: Conjunctiva heavy pigmentation  Cornea arcus with decreased tear film each eye   anterior chamber  deep and quiet each eye  Iris Brown each eye with iris atrophy OS  Lens +3-4 nuclear and cortical opacity white cataract OD, posterior chamber intraocular lens implant OS with posterior capsule fibrosis  Vitreous  CCT  Ta   in mmHg  18  OD       18     OS Time 5:20 PM  Gonio   Dilation tropicamide 1% phenylephrine 2.5%  Fundus: poor view OU  optic nerve  OD                                                                     OS   Macula      OD                                                    OS   Vessels  Periphery  H&P  B/P: 130/62 Resp:20 Pulse:58   Exam: GENERAL: Appearance: General appearance can be described as well-nourished, well-developed, and in no acute distress.    LYMPHATIC: HEAD, EARS, NOSE AND THROAT: Ears-Nose (external) Inspection: Externally, nose and ears are normal in appearance and without scars, lesions, or nodules.      Otoscopic Exam: External auditory canals and tympanic membranes are normal.      Hearing assessment shows  no problems with normal conversation.    Nose exam, internally, reveals nasal mucosa, septum and turbinates are unremarkable.    Teeth, Gingiva, and Lip Exams: No lesions or evidence of infection.      Oropharynx demonstrates oral mucosa, salivary glands, tongue, tonsils, posterior pharynx, hard-soft palates are normal.  EYES: see above  NECK: Neck tissue exam demonstrates no masses, symmetrical, and trachea is midline.      LUNGS and RESPIRATORY: Lung auscultation elicits no wheezing, rhonci, rales or rubs and with equal breath sounds.    Respiratory effort described as breathing is unlabored and chest movement is symmetrical.    HEART (Cardiovascular): Heart auscultation discovers regular rate and rhythm; no murmur, gallop or rub. Normal heart sounds, murmur, gallop.    ABDOMEN (Gastrointestinal): Mass/Tenderness Exam: Neither are present.     Liver/Spleen: No hepatomegaly or splenomegaly.   MUSCULOSKELETAL (BJE): Inspection-Palpation: No major bone, joint, tendon, or muscle changes.      NEUROLOGICAL: Alert and oriented. No major deficits of   coordination or sensation.      PSYCHIATRIC: Insight and judgment appear  both to be intact and appropriate.    Mood and affect are described as normal mood and full affect.    SKIN: Skin Inspection: No rashes or lesions.  ADMITTING DIAGNOSIS: Mature senile cataract   ICD#366.17  OD COMPLEX WILL REQUIRE  TRYPAN BLUE STAIN ANTERIOR  CAPSULE  Risk and benefits of surgery have been reviewed with the patient and the patient agrees to proceed with the surgical procedure.  SURGICAL TREATMENT PLAN: ECCE OR  phaco emulsion cataract extraction  WITH  intraocular lens implant  OD   Actions:     Handouts: Calcium Channel Blockers, Coumadin, Erythromycins, Furosemide, Insect Bites/stings, Potassium, Beta-Blockers.    ___________________________ Roy Whitaker, Jr. Starter - Inactive Problems:  

## 2012-08-14 NOTE — Interval H&P Note (Signed)
History and Physical Interval Note:  08/14/2012 10:53 AM  Haze Rushing  has presented today for surgery, with the diagnosis of SENILE MATURE CATARACT RIGHT EYE  The various methods of treatment have been discussed with the patient and family. After consideration of risks, benefits and other options for treatment, the patient has consented to  Procedure(s): CATARACT EXTRACTION PHACO AND INTRAOCULAR LENS PLACEMENT (IOC) (Right) as a surgical intervention .  The patient's history has been reviewed, patient examined, no change in status, stable for surgery.  I have reviewed the patient's chart and labs.  Questions were answered to the patient's satisfaction.     WHITAKER, ROY

## 2012-08-14 NOTE — Preoperative (Signed)
Beta Blockers   Reason not to administer Beta Blockers:Not Applicable 

## 2012-08-14 NOTE — Transfer of Care (Signed)
Immediate Anesthesia Transfer of Care Note  Patient: Sherry Peck  Procedure(s) Performed: Procedure(s): CATARACT EXTRACTION PHACO AND INTRAOCULAR LENS PLACEMENT (IOC) (Right)  Patient Location: PACU  Anesthesia Type:MAC  Level of Consciousness: awake, alert , oriented and sedated  Airway & Oxygen Therapy: Patient Spontanous Breathing and Patient connected to nasal cannula oxygen  Post-op Assessment: Report given to PACU RN, Post -op Vital signs reviewed and stable and Patient moving all extremities  Post vital signs: Reviewed and stable  Complications: No apparent anesthesia complications

## 2012-08-17 ENCOUNTER — Encounter (HOSPITAL_COMMUNITY): Payer: Self-pay | Admitting: Ophthalmology

## 2012-08-21 ENCOUNTER — Ambulatory Visit: Payer: Self-pay | Admitting: Cardiovascular Disease

## 2012-08-21 DIAGNOSIS — I4891 Unspecified atrial fibrillation: Secondary | ICD-10-CM

## 2012-08-21 DIAGNOSIS — Z7901 Long term (current) use of anticoagulants: Secondary | ICD-10-CM | POA: Insufficient documentation

## 2012-11-15 ENCOUNTER — Ambulatory Visit (INDEPENDENT_AMBULATORY_CARE_PROVIDER_SITE_OTHER): Payer: Medicare Other | Admitting: Cardiovascular Disease

## 2012-11-15 ENCOUNTER — Encounter: Payer: Self-pay | Admitting: Cardiovascular Disease

## 2012-11-15 VITALS — BP 118/54 | HR 51 | Resp 20 | Ht 60.0 in | Wt 141.5 lb

## 2012-11-15 DIAGNOSIS — I509 Heart failure, unspecified: Secondary | ICD-10-CM

## 2012-11-15 DIAGNOSIS — Z79899 Other long term (current) drug therapy: Secondary | ICD-10-CM

## 2012-11-15 DIAGNOSIS — E785 Hyperlipidemia, unspecified: Secondary | ICD-10-CM

## 2012-11-15 DIAGNOSIS — I5032 Chronic diastolic (congestive) heart failure: Secondary | ICD-10-CM

## 2012-11-15 DIAGNOSIS — I10 Essential (primary) hypertension: Secondary | ICD-10-CM

## 2012-11-15 DIAGNOSIS — I110 Hypertensive heart disease with heart failure: Secondary | ICD-10-CM

## 2012-11-15 DIAGNOSIS — I4891 Unspecified atrial fibrillation: Secondary | ICD-10-CM

## 2012-11-15 NOTE — Patient Instructions (Addendum)
Please make next INR appointment in 4-6 weeks.  Your physician recommends that you schedule a follow-up appointment in: 6 months

## 2012-11-16 DIAGNOSIS — I5032 Chronic diastolic (congestive) heart failure: Secondary | ICD-10-CM | POA: Insufficient documentation

## 2012-11-16 DIAGNOSIS — I119 Hypertensive heart disease without heart failure: Secondary | ICD-10-CM | POA: Insufficient documentation

## 2012-11-16 LAB — BASIC METABOLIC PANEL
Calcium: 8.6 mg/dL (ref 8.4–10.5)
Creat: 1.54 mg/dL — ABNORMAL HIGH (ref 0.50–1.10)

## 2012-11-17 NOTE — Assessment & Plan Note (Signed)
Rate control is excellent, possibly slightly excessive. Her beta blocker and calcium channel blocker have been necessary for ventricular rate control as well as blood pressure control. No changes made to the current set of medications she is on appropriate warfarin anticoagulation with therapeutic levels

## 2012-11-17 NOTE — Progress Notes (Signed)
Patient ID: Sherry Peck, female   DOB: 1927-11-03, 79 y.o.   MRN: [id number]     Reason for office visit CHF, atrial fibrillation, hypertension  The patient returns accompanied by her daughter who has always versus transmitter. She is originally from [place]. She wants to visit home but unfortunately the airline tickets her to expense of this year. Overall she feels well. She denies shortness of breath and has no longer been troubled by lower showed edema.    Allergies  Allergen Reactions  . Ace Inhibitors     REACTION: cough(2002)    Current Outpatient Prescriptions  Medication Sig Dispense Refill  . acetaminophen (TYLENOL) 500 MG tablet Take 1,000 mg by mouth 2 (two) times daily as needed for pain.      Marland Kitchen aliskiren (TEKTURNA) 300 MG tablet Take 300 mg by mouth daily.      Marland Kitchen atenolol (TENORMIN) 25 MG tablet Take 12.5 mg by mouth daily.      . Calcium Carbonate-Vitamin D (CALCIUM 600/VITAMIN D PO) Take 2 tablets by mouth daily.      Marland Kitchen diltiazem (TIAZAC) 360 MG 24 hr capsule Take 360 mg by mouth daily.      . DUREZOL 0.05 % EMUL Place 1 drop into both eyes daily.      . fish oil-omega-3 fatty acids 1000 MG capsule Take 2,000 mg by mouth daily.      Marland Kitchen PROLENSA 0.07 % SOLN Place 1 drop into both eyes daily.       Marland Kitchen warfarin (COUMADIN) 5 MG tablet Take 2.5-5 mg by mouth daily. 2.5mg  every day except 5mg  on Mondays       No current facility-administered medications for this visit.    Past Medical History  Diagnosis Date  . Hypertension   . 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 Cataract     right eye  . Dysrhythmia   . Chronic a-fib     sees Dr Royann Shivers @ [facility]  . Cardiomyopathy due to hypertension     stable  . CHF (congestive heart failure)   . Diastolic heart failure     Past Surgical History  Procedure Laterality Date  . No past surgeries    . Eye surgery    . Cataract extraction Left     in [place]  . Cataract extraction w/phaco Right 08/14/2012   Procedure: CATARACT EXTRACTION PHACO AND INTRAOCULAR LENS PLACEMENT (IOC);  Surgeon: Chalmers Guest, MD;  Location: [facility];  Service: Ophthalmology;  Laterality: Right;    No family history on file.  History   Social History  . Marital Status: Single    Spouse Name: N/A    Number of Children: N/A  . Years of Education: N/A   Occupational History  . Not on file.   Social History Main Topics  . Smoking status: Never Smoker   . Smokeless tobacco: Never Used  . Alcohol Use: No  . Drug Use: No  . Sexually Active: No   Other Topics Concern  . Not on file   Social History Narrative  . No narrative on file    Review of systems: The patient specifically denies any chest pain at rest or with exertion, dyspnea at rest or with exertion, orthopnea, paroxysmal nocturnal dyspnea, syncope, palpitations, focal neurological deficits, intermittent claudication, lower extremity edema, unexplained weight gain, cough, hemoptysis or wheezing.  The patient also denies abdominal pain, nausea, vomiting, dysphagia, diarrhea, constipation, polyuria, polydipsia, dysuria, hematuria, frequency, urgency,  abnormal bleeding or bruising, fever, chills, unexpected weight changes, mood swings, change in skin or hair texture, change in voice quality, auditory or visual problems, allergic reactions or rashes, new musculoskeletal complaints other than usual "aches and pains".   PHYSICAL EXAM BP 118/54  Pulse 51  Resp 20  Ht 5' (1.524 m)  Wt 141 lb 8 oz (64.184 kg)  BMI 27.63 kg/m2  General: Alert, oriented x3, no distress Head: no evidence of trauma, PERRL, EOMI, no exophtalmos or lid lag, no myxedema, no xanthelasma; normal ears, nose and oropharynx Neck: normal jugular venous pulsations and no hepatojugular reflux; brisk carotid pulses without delay and no carotid bruits Chest: clear to auscultation, no signs of consolidation by percussion or palpation, normal fremitus, symmetrical and full respiratory  excursions Cardiovascular: normal position and quality of the apical impulse, irregular rhythm, normal first and second heart sounds, no murmurs, rubs or gallops Abdomen: no tenderness or distention, no masses by palpation, no abnormal pulsatility or arterial bruits, normal bowel sounds, no hepatosplenomegaly Extremities: no clubbing, cyanosis or edema; 2+ radial, ulnar and brachial pulses bilaterally; 2+ right femoral, posterior tibial and dorsalis pedis pulses; 2+ left femoral, posterior tibial and dorsalis pedis pulses; no subclavian or femoral bruits Neurological: grossly nonfocal   EKG: Atrial fibrillation with slow ventricular response  Lipid Panel     Component Value Date/Time   CHOL 174 08/11/2008 2149   TRIG 81 08/11/2008 2149   HDL 63 08/11/2008 2149   CHOLHDL 2.8 Ratio 08/11/2008 2149   VLDL 16 08/11/2008 2149   LDLCALC 95 08/11/2008 2149    BMET    Component Value Date/Time   NA 131* 11/15/2012 1613   K 4.3 11/15/2012 1613   CL 93* 11/15/2012 1613   CO2 23 11/15/2012 1613   GLUCOSE 118* 11/15/2012 1613   BUN 22 11/15/2012 1613   CREATININE 1.54* 11/15/2012 1613   CREATININE 1.45* 08/13/2012 1600   CALCIUM 8.6 11/15/2012 1613   GFRNONAA 33* 08/13/2012 1600   GFRAA 38* 08/13/2012 1600     ASSESSMENT AND PLAN Chronic diastolic heart failure The heart appears since to be primarily related to diastolic dysfunction in the setting of hypertensive heart disease, possibly transiently worsened by tachycardia induced cardiomyopathy. She is now well compensated and is maintaining her weight without any diuretic therapy. [place] heart association functional class II., Euvolemic  Atrial fibrillation Rate control is excellent, possibly slightly excessive. Her beta blocker and calcium channel blocker have been necessary for ventricular rate control as well as blood pressure control. No changes made to the current set of medications she is on appropriate warfarin anticoagulation with therapeutic  levels  HYPERLIPIDEMIA All current lipid parameters appear to be within the desirable range  HYPERTENSION  Good control   Orders Placed This Encounter  Procedures  . Basic Metabolic Panel (BMET)  . EKG 12-Lead   Meds ordered this encounter  Medications  . diltiazem (TIAZAC) 360 MG 24 hr capsule    Sig: Take 360 mg by mouth daily.  Marland Kitchen PROLENSA 0.07 % SOLN    Sig: Place 1 drop into both eyes daily.   . DUREZOL 0.05 % EMUL    Sig: Place 1 drop into both eyes daily.    Junious Silk, MD, [facility] [phone] office [phone] pager

## 2012-11-17 NOTE — Assessment & Plan Note (Signed)
Good control

## 2012-11-17 NOTE — Assessment & Plan Note (Signed)
All current lipid parameters appear to be within the desirable range

## 2012-11-17 NOTE — Assessment & Plan Note (Signed)
The heart appears since to be primarily related to diastolic dysfunction in the setting of hypertensive heart disease, possibly transiently worsened by tachycardia induced cardiomyopathy. She is now well compensated and is maintaining her weight without any diuretic therapy. [place] heart association functional class II., Euvolemic

## 2012-11-18 ENCOUNTER — Telehealth: Payer: Self-pay | Admitting: *Deleted

## 2012-11-18 ENCOUNTER — Ambulatory Visit (INDEPENDENT_AMBULATORY_CARE_PROVIDER_SITE_OTHER): Payer: Self-pay | Admitting: Pharmacist Clinician (PhC)/ Clinical Pharmacy Specialist

## 2012-11-18 DIAGNOSIS — I4891 Unspecified atrial fibrillation: Secondary | ICD-10-CM

## 2012-11-18 DIAGNOSIS — Z79899 Other long term (current) drug therapy: Secondary | ICD-10-CM

## 2012-11-18 DIAGNOSIS — Z7901 Long term (current) use of anticoagulants: Secondary | ICD-10-CM

## 2012-11-18 NOTE — Telephone Encounter (Signed)
LMOM stable lab results, continue current meds and recheck labs in one month.  Lab request mailed to pt.

## 2012-11-18 NOTE — Telephone Encounter (Signed)
Message copied by Vita Barley on Mon Nov 18, 2012  8:55 AM ------      Message from: Thurmon Fair      Created: Sat Nov 16, 2012  8:24 AM       Same Rx, repeat BMET in one month please ------

## 2012-12-12 ENCOUNTER — Ambulatory Visit: Payer: Medicare Other | Admitting: Pharmacist Clinician (PhC)/ Clinical Pharmacy Specialist

## 2012-12-19 LAB — BASIC METABOLIC PANEL
Chloride: 91 mEq/L — ABNORMAL LOW (ref 96–112)
Creat: 1.5 mg/dL — ABNORMAL HIGH (ref 0.50–1.10)
Potassium: 3.6 mEq/L (ref 3.5–5.3)

## 2012-12-20 ENCOUNTER — Ambulatory Visit: Payer: Medicare Other | Admitting: Pharmacist Clinician (PhC)/ Clinical Pharmacy Specialist

## 2012-12-23 ENCOUNTER — Telehealth: Payer: Self-pay | Admitting: *Deleted

## 2012-12-23 NOTE — Telephone Encounter (Signed)
Message copied by Vita Barley on Mon Dec 23, 2012 10:36 AM ------      Message from: Thurmon Fair      Created: Thu Dec 19, 2012  4:05 PM       Mild abnormalities are not different from before ------

## 2012-12-23 NOTE — Telephone Encounter (Signed)
Lab results called to patient

## 2013-01-17 ENCOUNTER — Ambulatory Visit (INDEPENDENT_AMBULATORY_CARE_PROVIDER_SITE_OTHER): Payer: Medicare Other | Admitting: Pharmacist Clinician (PhC)/ Clinical Pharmacy Specialist

## 2013-01-17 VITALS — BP 136/60 | HR 60

## 2013-01-17 DIAGNOSIS — I4891 Unspecified atrial fibrillation: Secondary | ICD-10-CM

## 2013-01-17 DIAGNOSIS — Z7901 Long term (current) use of anticoagulants: Secondary | ICD-10-CM

## 2013-01-17 LAB — POCT INR: INR: 2.1

## 2013-02-04 ENCOUNTER — Telehealth: Payer: Self-pay | Admitting: Pharmacist Clinician (PhC)/ Clinical Pharmacy Specialist

## 2013-02-04 MED ORDER — WARFARIN SODIUM 5 MG PO TABS: ORAL_TABLET | ORAL | Status: DC

## 2013-02-04 NOTE — Telephone Encounter (Signed)
Please call.

## 2013-02-21 ENCOUNTER — Ambulatory Visit: Payer: Medicare Other | Admitting: Pharmacist Clinician (PhC)/ Clinical Pharmacy Specialist

## 2013-02-25 ENCOUNTER — Ambulatory Visit (INDEPENDENT_AMBULATORY_CARE_PROVIDER_SITE_OTHER): Payer: Medicare Other | Admitting: Pharmacist Clinician (PhC)/ Clinical Pharmacy Specialist

## 2013-02-25 VITALS — BP 132/60 | HR 56

## 2013-02-25 DIAGNOSIS — I4891 Unspecified atrial fibrillation: Secondary | ICD-10-CM

## 2013-02-25 DIAGNOSIS — Z7901 Long term (current) use of anticoagulants: Secondary | ICD-10-CM

## 2013-02-25 LAB — POCT INR: INR: 1.5

## 2013-03-03 ENCOUNTER — Telehealth: Payer: Self-pay | Admitting: Pharmacist Clinician (PhC)/ Clinical Pharmacy Specialist

## 2013-03-28 ENCOUNTER — Ambulatory Visit (INDEPENDENT_AMBULATORY_CARE_PROVIDER_SITE_OTHER): Payer: Medicare Other | Admitting: Pharmacist Clinician (PhC)/ Clinical Pharmacy Specialist

## 2013-03-28 VITALS — BP 146/64 | HR 56

## 2013-03-28 DIAGNOSIS — Z7901 Long term (current) use of anticoagulants: Secondary | ICD-10-CM

## 2013-03-28 DIAGNOSIS — I4891 Unspecified atrial fibrillation: Secondary | ICD-10-CM

## 2013-04-29 ENCOUNTER — Ambulatory Visit (INDEPENDENT_AMBULATORY_CARE_PROVIDER_SITE_OTHER): Payer: Medicare Other | Admitting: Pharmacist Clinician (PhC)/ Clinical Pharmacy Specialist

## 2013-04-29 VITALS — BP 136/60 | HR 60

## 2013-04-29 DIAGNOSIS — Z7901 Long term (current) use of anticoagulants: Secondary | ICD-10-CM

## 2013-04-29 DIAGNOSIS — I4891 Unspecified atrial fibrillation: Secondary | ICD-10-CM

## 2013-04-29 LAB — POCT INR: INR: 2.8

## 2013-05-27 ENCOUNTER — Ambulatory Visit (INDEPENDENT_AMBULATORY_CARE_PROVIDER_SITE_OTHER): Payer: Medicare Other | Admitting: Pharmacist Clinician (PhC)/ Clinical Pharmacy Specialist

## 2013-05-27 VITALS — BP 146/70 | HR 56

## 2013-05-27 DIAGNOSIS — Z7901 Long term (current) use of anticoagulants: Secondary | ICD-10-CM

## 2013-05-27 DIAGNOSIS — I4891 Unspecified atrial fibrillation: Secondary | ICD-10-CM

## 2013-05-27 LAB — POCT INR: INR: 2.6

## 2013-05-30 ENCOUNTER — Other Ambulatory Visit: Payer: Self-pay | Admitting: Pharmacist Clinician (PhC)/ Clinical Pharmacy Specialist

## 2013-06-24 ENCOUNTER — Encounter: Payer: Self-pay | Admitting: *Deleted

## 2013-06-25 ENCOUNTER — Encounter: Payer: Self-pay | Admitting: Cardiovascular Disease

## 2013-06-25 ENCOUNTER — Ambulatory Visit (INDEPENDENT_AMBULATORY_CARE_PROVIDER_SITE_OTHER): Payer: Medicare Other | Admitting: Pharmacist Clinician (PhC)/ Clinical Pharmacy Specialist

## 2013-06-25 ENCOUNTER — Ambulatory Visit (INDEPENDENT_AMBULATORY_CARE_PROVIDER_SITE_OTHER): Payer: Medicare Other | Admitting: Cardiovascular Disease

## 2013-06-25 VITALS — BP 132/50 | HR 56 | Resp 16 | Ht 60.0 in | Wt 135.3 lb

## 2013-06-25 DIAGNOSIS — I5032 Chronic diastolic (congestive) heart failure: Secondary | ICD-10-CM

## 2013-06-25 DIAGNOSIS — I4891 Unspecified atrial fibrillation: Secondary | ICD-10-CM

## 2013-06-25 DIAGNOSIS — Z7901 Long term (current) use of anticoagulants: Secondary | ICD-10-CM

## 2013-06-25 DIAGNOSIS — E785 Hyperlipidemia, unspecified: Secondary | ICD-10-CM

## 2013-06-25 LAB — POCT INR: INR: 2.3

## 2013-06-25 MED ORDER — RIVAROXABAN 15 MG PO TABS: 15.0000 mg | ORAL_TABLET | Freq: Every day | ORAL | Status: DC

## 2013-06-25 NOTE — Progress Notes (Signed)
Patient ID: Sherry Peck, female   DOB: 12-27-1927, 80 y.o.   MRN: [id number]      Reason for office visit Followup atrial fibrillation, hypertension, diastolic heart failure  Sherry Peck has not had any new serious health problems since she was last seen in our office. She has had therapeutic levels of anticoagulation on warfarin without any bleeding complications. She does not have a history of stroke TIA   Allergies  Allergen Reactions  . Ace Inhibitors     REACTION: cough(2002)    Current Outpatient Prescriptions  Medication Sig Dispense Refill  . acetaminophen (TYLENOL) 500 MG tablet Take 1,000 mg by mouth 2 (two) times daily as needed for pain.      Marland Kitchen aliskiren (TEKTURNA) 300 MG tablet Take 300 mg by mouth daily.      Marland Kitchen atenolol (TENORMIN) 25 MG tablet Take 12.5 mg by mouth daily.      . Calcium Carbonate-Vitamin D (CALCIUM 600/VITAMIN D PO) Take 2 tablets by mouth daily.      . Coenzyme Q10 (CO Q 10) 100 MG CAPS Take 100 mg by mouth daily.      Marland Kitchen diltiazem (TIAZAC) 360 MG 24 hr capsule Take 360 mg by mouth daily.      . DUREZOL 0.05 % EMUL Place 1 drop into both eyes daily.      . fish oil-omega-3 fatty acids 1000 MG capsule Take 2,000 mg by mouth daily.      Marland Kitchen PROLENSA 0.07 % SOLN Place 1 drop into both eyes daily.       . Rivaroxaban (XARELTO) 15 MG TABS tablet Take 1 tablet (15 mg total) by mouth daily with supper.  90 tablet  3   No current facility-administered medications for this visit.    Past Medical History  Diagnosis Date  . Hypertension   . 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 Cataract     right eye  . Dysrhythmia   . Chronic a-fib     sees Dr Royann Shivers @ [facility]  . Cardiomyopathy due to hypertension     stable  . CHF (congestive heart failure)   . Diastolic heart failure   . Moderate mitral insufficiency   . Hx of echocardiogram 05/01/2011    EF 50-55% borderline left ventricular systolic function and moderate to serverely dilated  left atrium.    Past Surgical History  Procedure Laterality Date  . No past surgeries    . Eye surgery    . Cataract extraction Left     in [place]  . Cataract extraction w/phaco Right 08/14/2012    Procedure: CATARACT EXTRACTION PHACO AND INTRAOCULAR LENS PLACEMENT (IOC);  Surgeon: Chalmers Guest, MD;  Location: [facility] OR;  Service: Ophthalmology;  Laterality: Right;    No family history on file.  History   Social History  . Marital Status: Single    Spouse Name: N/A    Number of Children: N/A  . Years of Education: N/A   Occupational History  . Not on file.   Social History Main Topics  . Smoking status: Never Smoker   . Smokeless tobacco: Never Used  . Alcohol Use: No  . Drug Use: No  . Sexual Activity: No   Other Topics Concern  . Not on file   Social History Narrative  . No narrative on file    Review of systems: The patient specifically denies any chest pain at rest or with exertion, dyspnea at rest  or with light exertion (class II DOE), orthopnea, paroxysmal nocturnal dyspnea, syncope, palpitations, focal neurological deficits, intermittent claudication, lower extremity edema, unexplained weight gain, cough, hemoptysis or wheezing.  The patient also denies abdominal pain, nausea, vomiting, dysphagia, diarrhea, constipation, polyuria, polydipsia, dysuria, hematuria, frequency, urgency, abnormal bleeding or bruising, fever, chills, unexpected weight changes, mood swings, change in skin or hair texture, change in voice quality, auditory or visual problems, allergic reactions or rashes, new musculoskeletal complaints other than usual "aches and pains".   PHYSICAL EXAM BP 132/50  Pulse 56  Resp 16  Ht 5' (1.524 m)  Wt 61.372 kg (135 lb 4.8 oz)  BMI 26.42 kg/m2 General: Alert, oriented x3, no distress  Head: no evidence of trauma, PERRL, EOMI, no exophtalmos or lid lag, no myxedema, no xanthelasma; normal ears, nose and oropharynx  Neck: normal jugular venous pulsations  and no hepatojugular reflux; brisk carotid pulses without delay and no carotid bruits  Chest: clear to auscultation, no signs of consolidation by percussion or palpation, normal fremitus, symmetrical and full respiratory excursions  Cardiovascular: normal position and quality of the apical impulse, irregular rhythm, normal first and second heart sounds, no murmurs, rubs or gallops  Abdomen: no tenderness or distention, no masses by palpation, no abnormal pulsatility or arterial bruits, normal bowel sounds, no hepatosplenomegaly  Extremities: no clubbing, cyanosis or edema; 2+ radial, ulnar and brachial pulses bilaterally; 2+ right femoral, posterior tibial and dorsalis pedis pulses; 2+ left femoral, posterior tibial and dorsalis pedis pulses; no subclavian or femoral bruits  Neurological: grossly nonfocal    EKG: Atrial fibrillation with slow ventricular response  Lipid Panel     Component Value Date/Time   CHOL 174 08/11/2008 2149   TRIG 81 08/11/2008 2149   HDL 63 08/11/2008 2149   CHOLHDL 2.8 Ratio 08/11/2008 2149   VLDL 16 08/11/2008 2149   LDLCALC 95 08/11/2008 2149    BMET    Component Value Date/Time   NA 133* 12/19/2012 0835   K 3.6 12/19/2012 0835   CL 91* 12/19/2012 0835   CO2 30 12/19/2012 0835   GLUCOSE 106* 12/19/2012 0835   BUN 16 12/19/2012 0835   CREATININE 1.50* 12/19/2012 0835   CREATININE 1.45* 08/13/2012 1600   CALCIUM 9.3 12/19/2012 0835   GFRNONAA 33* 08/13/2012 1600   GFRAA 38* 08/13/2012 1600     ASSESSMENT AND PLAN Atrial fibrillation Permanent arrhythmia, well rate controlled on combination of calcium channel blocker and beta blocker. Since she is planning an extended trip to [place] where she will be unable to get routine prothrombin time monitoring, I recommended that we switch from warfarin to a novel anticoagulants. We discussed the differences between the short acting Xarelto and long-acting warfarin. The risk of bleeding is similar. Our pharmacist will help with the  transition from one drug to another. She'll stop taking warfarin as soon as she initiates treatment with a new agent.  Chronic diastolic heart failure By clinical criteria she appears to be euvolemic. NYHA functional class II. She is on a RAAS inhibitor and beta blockers in addition to diltiazem which is necessary for rate control.  Orders Placed This Encounter  Procedures  . EKG 12-Lead   Meds ordered this encounter  Medications  . Rivaroxaban (XARELTO) 15 MG TABS tablet    Sig: Take 1 tablet (15 mg total) by mouth daily with supper.    Dispense:  90 tablet    Refill:  3    Shadaya Marschner  Thurmon Fair, MD, [facility] (  [phone] office [phone] pager

## 2013-06-25 NOTE — Assessment & Plan Note (Signed)
Her last lipid profile showed excellent parameters for her personal risk profile.

## 2013-06-25 NOTE — Assessment & Plan Note (Signed)
By clinical criteria she appears to be euvolemic. NYHA functional class II. She is on a RAAS inhibitor and beta blockers in addition to diltiazem which is necessary for rate control.

## 2013-06-25 NOTE — Patient Instructions (Addendum)
STOP WARFARIN.  START Xarelto 15mg  daily.  Your physician recommends that you schedule a follow-up appointment in: ONE YEAR.

## 2013-06-25 NOTE — Assessment & Plan Note (Signed)
Permanent arrhythmia, well rate controlled on combination of calcium channel blocker and beta blocker. Since she is planning an extended trip to [place] where she will be unable to get routine prothrombin time monitoring, I recommended that we switch from warfarin to a novel anticoagulants. We discussed the differences between the short acting Xarelto and long-acting warfarin. The risk of bleeding is similar. Our pharmacist will help with the transition from one drug to another. She'll stop taking warfarin as soon as she initiates treatment with a new agent.

## 2013-06-26 ENCOUNTER — Telehealth: Payer: Self-pay | Admitting: Cardiovascular Disease

## 2013-06-26 NOTE — Telephone Encounter (Signed)
Returned call and spoke w/ [place].  Informed pt was switched to Xarelto on yesterday.  Verbalized understanding.

## 2013-06-26 NOTE — Telephone Encounter (Signed)
Received a prescription for Xarelto yesterday. Should the patient be on Warfarin or Xarelto?

## 2013-07-21 ENCOUNTER — Other Ambulatory Visit: Payer: Self-pay | Admitting: Cardiovascular Disease

## 2013-09-24 NOTE — Telephone Encounter (Signed)
Encounter closed---TP 09/24/13 

## 2014-02-12 ENCOUNTER — Telehealth: Payer: Self-pay | Admitting: *Deleted

## 2014-02-12 MED ORDER — APIXABAN 2.5 MG PO TABS: 2.5000 mg | ORAL_TABLET | Freq: Two times a day (BID) | ORAL | Status: DC

## 2014-02-12 MED ORDER — RIVAROXABAN 15 MG PO TABS: 15.0000 mg | ORAL_TABLET | Freq: Every day | ORAL | Status: DC

## 2014-02-12 NOTE — Telephone Encounter (Signed)
Daughter notified insurance will not cover Xarelto.  Instructed to start Eliquis 2.5mg  bid when she runs out of her current Rx of Xarelto.  Voiced understanding.  New Rx sent to [facility].

## 2014-02-12 NOTE — Telephone Encounter (Signed)
Daughter notified Xarelto has been approved x 1 year.  It will NOT be switched to Eliquis.

## 2014-02-12 NOTE — Telephone Encounter (Signed)
[facility] approved Xarelto  qd 02/12/14 thru 02/12/14.

## 2014-02-13 ENCOUNTER — Telehealth: Payer: Self-pay | Admitting: *Deleted

## 2014-02-13 MED ORDER — IRBESARTAN 300 MG PO TABS: 300.0000 mg | ORAL_TABLET | Freq: Every day | ORAL | Status: DC

## 2014-02-13 NOTE — Telephone Encounter (Signed)
Message left to inform patient insurance will not cover Tekturna - Dr. Salena Saner has switched it to Irbesartan  daily.  Rx sent to pharmacy.

## 2014-02-20 IMAGING — CR DG CHEST 2V
2 series · 2 of 2 positions shown · non-contrast
Comparison: Chest x-ray of 03/31/2011

CLINICAL DATA: Cough, shortness of breath

CHEST - 2 VIEW

[w chest pa]
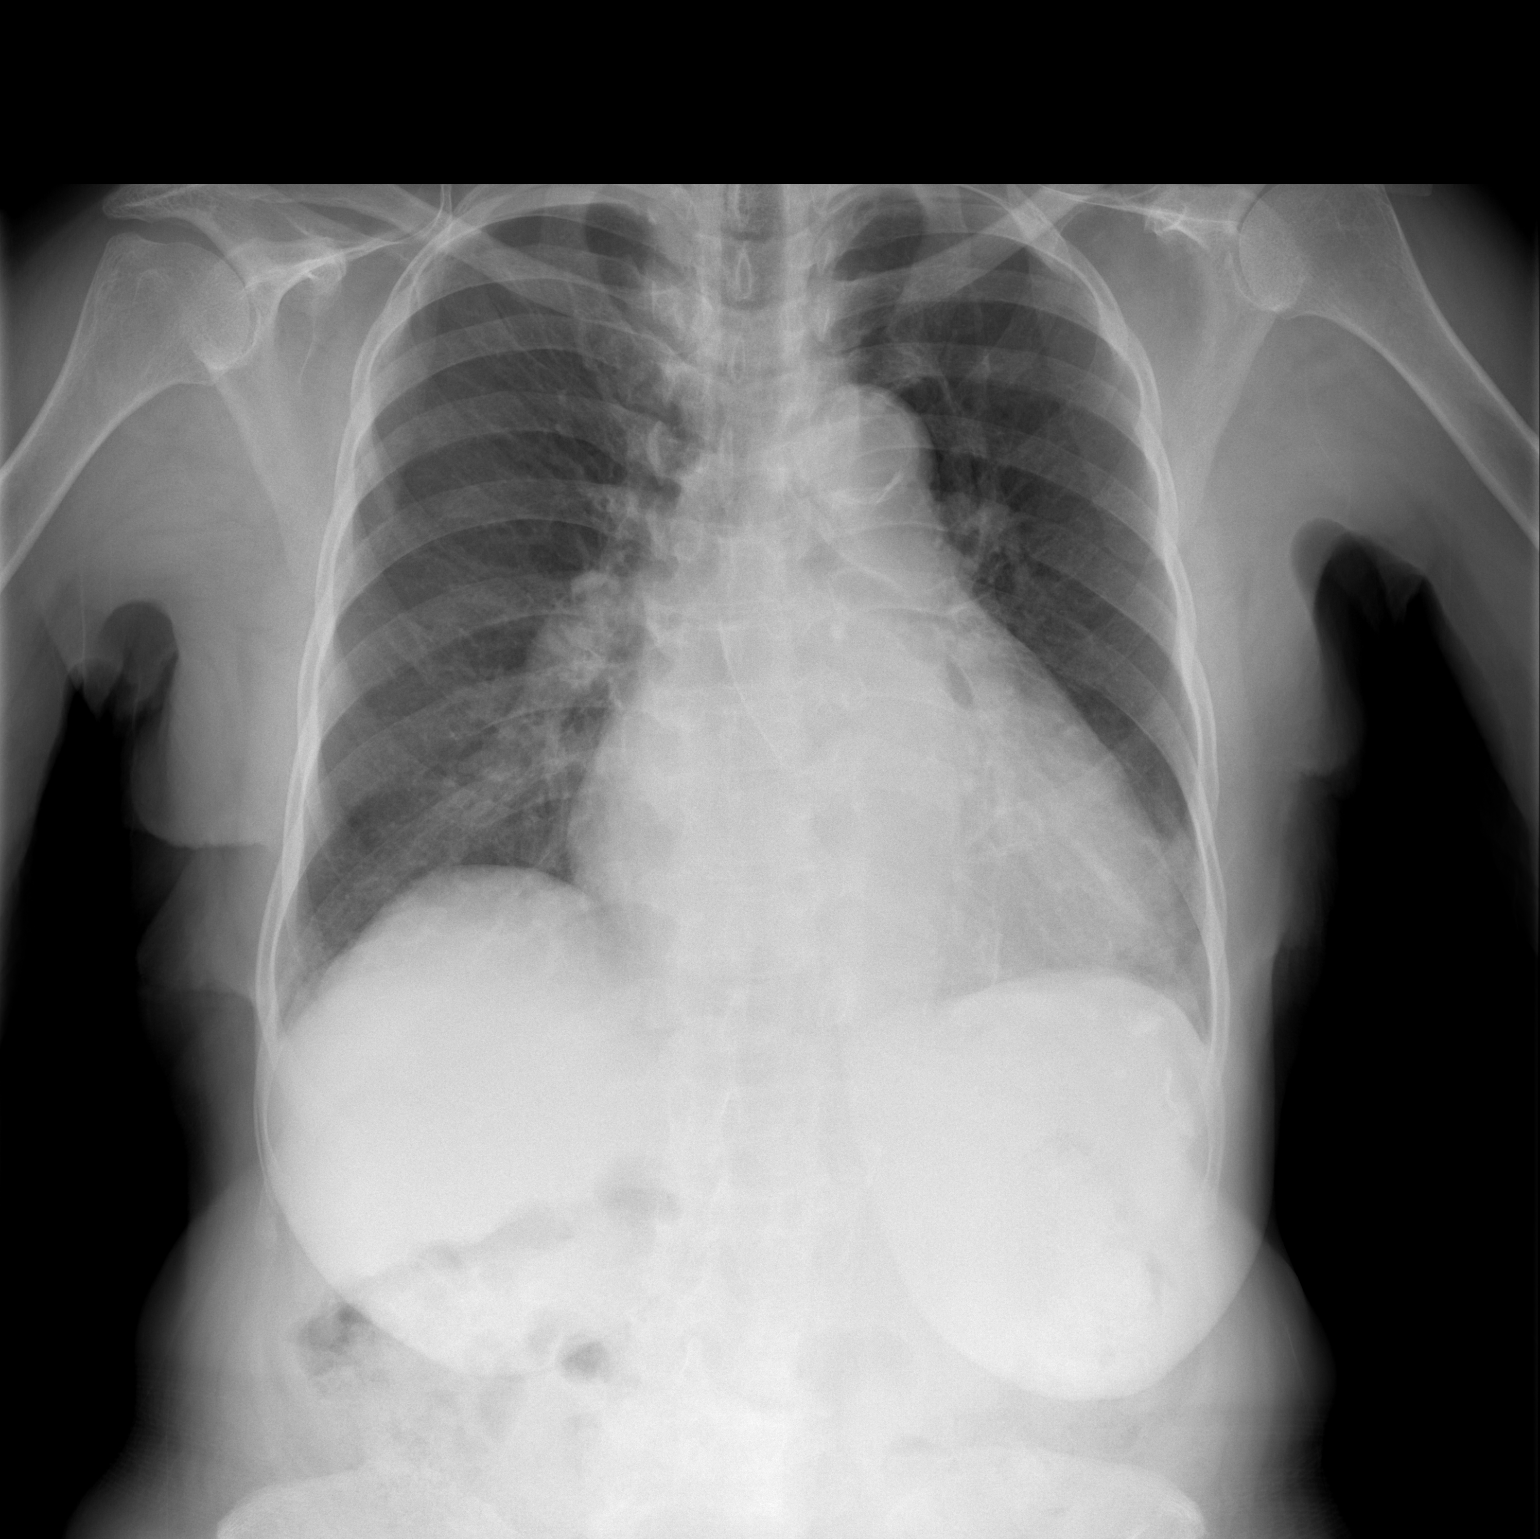

[w chest lat]
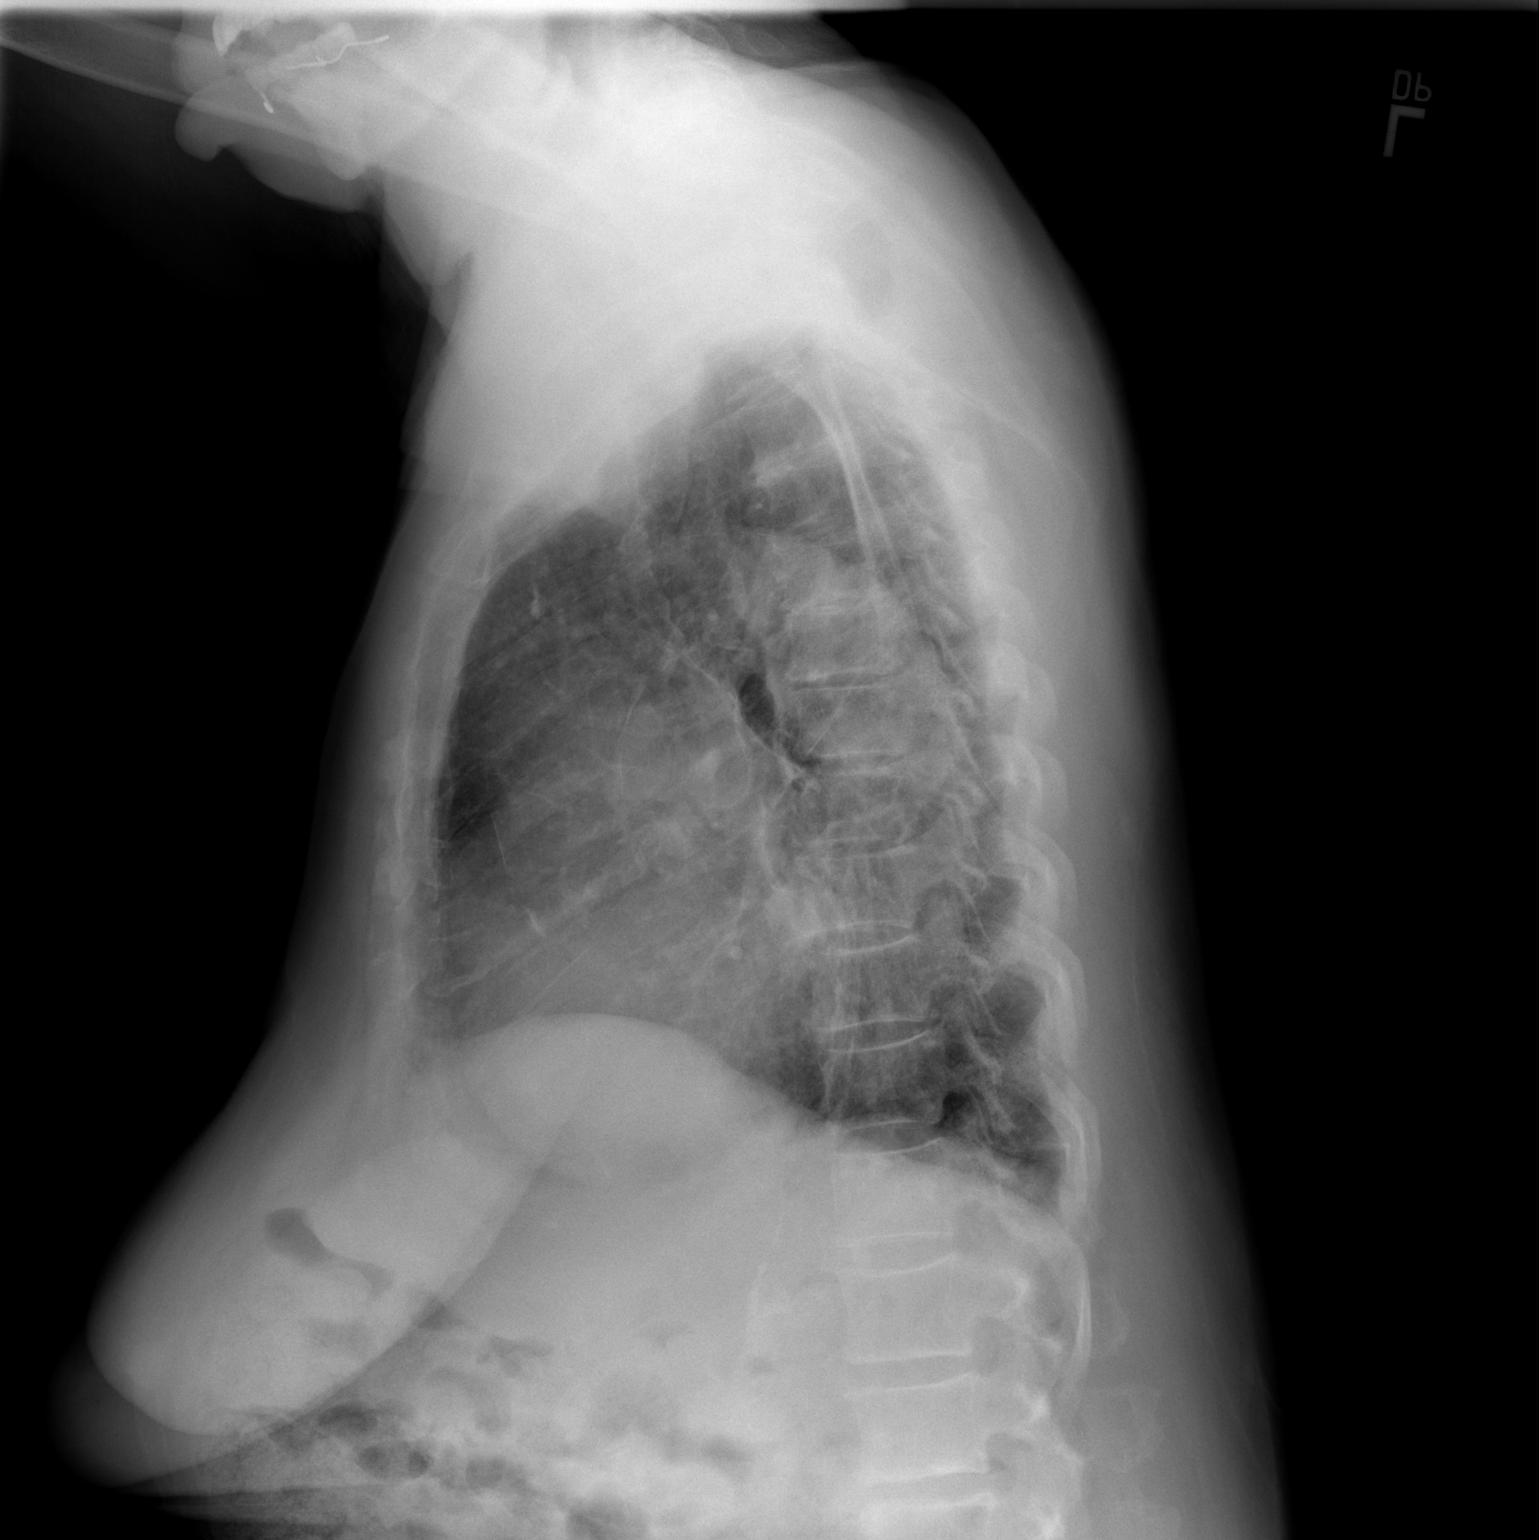

[2 of 2 positions shown; findings below may reference images not displayed]

FINDINGS: Cardiomegaly is stable.  No active infiltrate or edema is
seen.  No effusion is noted.  No bony abnormality is seen.
IMPRESSION: Stable cardiomegaly.  No active lung disease.

## 2014-03-26 ENCOUNTER — Ambulatory Visit (INDEPENDENT_AMBULATORY_CARE_PROVIDER_SITE_OTHER): Payer: PRIVATE HEALTH INSURANCE | Admitting: Family Medicine

## 2014-03-26 VITALS — BP 140/58 | HR 52 | Temp 98.5°F | Resp 16 | Ht 60.0 in | Wt 135.0 lb

## 2014-03-26 DIAGNOSIS — I10 Essential (primary) hypertension: Secondary | ICD-10-CM

## 2014-03-26 DIAGNOSIS — H04202 Unspecified epiphora, left lacrimal gland: Secondary | ICD-10-CM

## 2014-03-26 DIAGNOSIS — Z23 Encounter for immunization: Secondary | ICD-10-CM

## 2014-03-26 NOTE — Progress Notes (Signed)
Subjective:    Patient ID: Sherry Peck, female    DOB: 1928/03/05, 80 y.o.   MRN: [id number]  This chart was scribed for Nilda SimmerKristi Smith, MD by Jarvis Morganaylor Ferguson, ED Scribe. This patient was seen in Room 10 and the patient's care was started at 5:55 PM.  03/26/2014  pt here to establish care, flu shot and Hypertension   HPI HPI Comments: Sherry Rushingdwoa Flater is a 80 y.o. female who presents to the [facility] to establish care with us as her new PCP. Pt is also here for a follow up for her high blood pressure. She denies any problem with her BP medication. Pt would like to get a flu shot at this visit and pneumococcal vaccine. She also has a h/o of A-fib for which she takes Xarelto. Pt reports that she is intermittently constipated and does not have a bowel movement every day. Pt had a cataract extraction 1 year ago. She notes some associated blurry vision in both eyes. She states she regularly follows up with her eye doctor. She denies any chest pain, shortness of breath, or dizziness.  Review of Systems  Constitutional: Negative for fever and chills.  Eyes: Positive for visual disturbance (blurry).  Respiratory: Negative for shortness of breath.   Cardiovascular: Negative for chest pain and leg swelling.  Gastrointestinal: Positive for constipation (intermittent). Negative for abdominal pain.  All other systems reviewed and are negative.   Past Medical History  Diagnosis Date  . Hypertension   . 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 Cataract     right eye  . Dysrhythmia   . Chronic a-fib     sees Dr Royann [facility]iversroitoru @ [facility]  . Cardiomyopathy due to hypertension     stable  . CHF (congestive heart failure)   . Diastolic heart failure   . Moderate mitral insufficiency   . Hx of echocardiogram 05/01/2011    EF 50-55% borderline left ventricular systolic function and moderate to serverely dilated left atrium.  . Arthritis    Past Surgical History  Procedure  Laterality Date  . No past surgeries    . Eye surgery    . [place]aract extraction Left     in [place]  . Cataract extraction w/phaco Right 08/14/2012    Procedure: CATARACT EXTRACTION PHACO AND INTRAOCULAR LENS PLACEMENT (IOC);  Surgeon: Chalmers Guestoy Whitaker, MD;  Location: [facility] OR;  Service: Ophthalmology;  Laterality: Right;   Allergies  Allergen Reactions  . Ace Inhibitors     REACTION: cough(2002)   Current Outpatient Prescriptions  Medication Sig Dispense Refill  . atenolol (TENORMIN) 25 MG tablet TAKE 1/2 TABLET BY MOUTH EVERY DAY  15 tablet  9  . Calcium Carbonate-Vitamin D (CALCIUM 600/VITAMIN D PO) Take 2 tablets by mouth daily.      . Coenzyme Q10 (CO Q 10) 100 MG CAPS Take 100 mg by mouth daily.      Marland Kitchen. diltiazem (TIAZAC) 360 MG 24 hr capsule Take 360 mg by mouth daily.      . fish oil-omega-3 fatty acids 1000 MG capsule Take 2,000 mg by mouth daily.      . Rivaroxaban (XARELTO) 15 MG TABS tablet Take 1 tablet (15 mg total) by mouth daily with supper.  90 tablet  1  . acetaminophen (TYLENOL) 500 MG tablet Take 1,000 mg by mouth 2 (two) times daily as needed for pain.      . DUREZOL 0.05 % EMUL Place  1 drop into both eyes daily.      Marland Kitchen. PROLENSA 0.07 % SOLN Place 1 drop into both eyes daily.        No current facility-administered medications for this visit.       Objective:    Triage Vitals: BP 140/58  Pulse 52  Temp(Src) 98.5 F (36.9 C) (Oral)  Resp 16  Ht 5' (1.524 m)  Wt 135 lb (61.236 kg)  BMI 26.37 kg/m2  SpO2 98%  Physical Exam  Nursing note and vitals reviewed. Constitutional: She is oriented to person, place, and time. She appears well-developed and well-nourished. No distress.  HENT:  Head: Normocephalic and atraumatic.  Eyes: EOM are normal.  Linear white opacity in inferior aspect of the iris   Neck: Neck supple. No tracheal deviation present.  Cardiovascular: Normal rate.   Pulmonary/Chest: Effort normal. No respiratory distress.  Musculoskeletal: Normal range  of motion.  Neurological: She is alert and oriented to person, place, and time.  Skin: Skin is warm and dry.  Psychiatric: She has a normal mood and affect. Her behavior is normal.   Results for orders placed in visit on 03/26/14  CBC      Result Value Ref Range   WBC 6.6  4.0 - 10.5 K/uL   RBC 4.23  3.87 - 5.11 MIL/uL   Hemoglobin 14.3  12.0 - 15.0 g/dL   HCT 11.940.1  14.736.0 - 82.946.0 %   MCV 94.8  78.0 - 100.0 fL   MCH 33.8  26.0 - 34.0 pg   MCHC 35.7  30.0 - 36.0 g/dL   RDW 56.213.9  13.011.5 - 86.515.5 %   Platelets 242  150 - 400 K/uL  COMPREHENSIVE METABOLIC PANEL      Result Value Ref Range   Sodium 137  135 - 145 mEq/L   Potassium 3.6  3.5 - 5.3 mEq/L   Chloride 97  96 - 112 mEq/L   CO2 29  19 - 32 mEq/L   Glucose, Bld 113 (*) 70 - 99 mg/dL   BUN 26 (*) 6 - 23 mg/dL   Creat 7.841.48 (*) 6.960.50 - 1.10 mg/dL   Total Bilirubin 0.7  0.2 - 1.2 mg/dL   Alkaline Phosphatase 112  39 - 117 U/L   AST 16  0 - 37 U/L   ALT 10  0 - 35 U/L   Total Protein 7.5  6.0 - 8.3 g/dL   Albumin 3.8  3.5 - 5.2 g/dL   Calcium 8.8  8.4 - 29.510.5 mg/dL    [id number] AND INFLUENZA ADMINISTERED.    Assessment & Plan:   1. Essential hypertension - CBC - Comprehensive metabolic panel  2. Watering of left eye - Ambulatory referral to Ophthalmology  3. Needs flu shot - Flu Vaccine QUAD 36+ mos IM  4. Need for Streptococcus pneumoniae vaccination - Pneumococcal conjugate vaccine 13-valent IM   No orders of the defined types were placed in this encounter.    Return in about 3 months (around 06/26/2014) for complete physical examiniation.   I personally performed the services described in this documentation, which was scribed in my presence.  The recorded information has been reviewed and is accurate.  Nilda SimmerKristi Smith, M.D.  [facility]  [place] [address] [place], [place]  [id number] [phone] phone [phone] fax

## 2014-03-27 LAB — CBC
HCT: 40.1 % (ref 36.0–46.0)
Hemoglobin: 14.3 g/dL (ref 12.0–15.0)
MCH: 33.8 pg (ref 26.0–34.0)
MCHC: 35.7 g/dL (ref 30.0–36.0)
MCV: 94.8 fL (ref 78.0–100.0)
PLATELETS: 242 10*3/uL (ref 150–400)
RBC: 4.23 MIL/uL (ref 3.87–5.11)
RDW: 13.9 % (ref 11.5–15.5)
WBC: 6.6 10*3/uL (ref 4.0–10.5)

## 2014-03-27 LAB — COMPREHENSIVE METABOLIC PANEL
ALT: 10 U/L (ref 0–35)
AST: 16 U/L (ref 0–37)
Albumin: 3.8 g/dL (ref 3.5–5.2)
Alkaline Phosphatase: 112 U/L (ref 39–117)
BILIRUBIN TOTAL: 0.7 mg/dL (ref 0.2–1.2)
BUN: 26 mg/dL — ABNORMAL HIGH (ref 6–23)
CO2: 29 mEq/L (ref 19–32)
Calcium: 8.8 mg/dL (ref 8.4–10.5)
Chloride: 97 mEq/L (ref 96–112)
Creat: 1.48 mg/dL — ABNORMAL HIGH (ref 0.50–1.10)
Glucose, Bld: 113 mg/dL — ABNORMAL HIGH (ref 70–99)
Potassium: 3.6 mEq/L (ref 3.5–5.3)
Sodium: 137 mEq/L (ref 135–145)
Total Protein: 7.5 g/dL (ref 6.0–8.3)

## 2014-04-03 ENCOUNTER — Other Ambulatory Visit: Payer: Self-pay | Admitting: Cardiovascular Disease

## 2014-04-06 NOTE — Telephone Encounter (Signed)
Rx was sent to pharmacy electronically. 

## 2014-05-01 ENCOUNTER — Other Ambulatory Visit: Payer: Self-pay | Admitting: Cardiovascular Disease

## 2014-05-04 NOTE — Telephone Encounter (Signed)
This is a Dr Cox CommunicationsCroitoru patient.

## 2014-06-25 ENCOUNTER — Other Ambulatory Visit: Payer: Self-pay | Admitting: Cardiovascular Disease

## 2014-07-08 ENCOUNTER — Encounter: Payer: Self-pay | Admitting: Cardiovascular Disease

## 2014-07-08 ENCOUNTER — Ambulatory Visit (INDEPENDENT_AMBULATORY_CARE_PROVIDER_SITE_OTHER): Payer: Medicare Other | Admitting: Cardiovascular Disease

## 2014-07-08 VITALS — BP 150/62 | HR 58 | Ht 60.0 in | Wt 138.4 lb

## 2014-07-08 DIAGNOSIS — I509 Heart failure, unspecified: Secondary | ICD-10-CM

## 2014-07-08 DIAGNOSIS — I110 Hypertensive heart disease with heart failure: Secondary | ICD-10-CM

## 2014-07-08 DIAGNOSIS — I5032 Chronic diastolic (congestive) heart failure: Secondary | ICD-10-CM

## 2014-07-08 DIAGNOSIS — I4891 Unspecified atrial fibrillation: Secondary | ICD-10-CM

## 2014-07-08 MED ORDER — IRBESARTAN 300 MG PO TABS: 300.0000 mg | ORAL_TABLET | Freq: Every day | ORAL | Status: AC

## 2014-07-08 MED ORDER — ATENOLOL 25 MG PO TABS: 12.5000 mg | ORAL_TABLET | Freq: Every day | ORAL | Status: DC

## 2014-07-08 MED ORDER — DILTIAZEM HCL ER BEADS 360 MG PO CP24: 360.0000 mg | ORAL_CAPSULE | Freq: Every day | ORAL | Status: AC

## 2014-07-08 MED ORDER — RIVAROXABAN 15 MG PO TABS: 15.0000 mg | ORAL_TABLET | Freq: Every day | ORAL | Status: AC

## 2014-07-08 NOTE — Patient Instructions (Signed)
Dr. Croitoru recommends that you schedule a follow-up appointment in: 6 months    

## 2014-07-09 NOTE — Progress Notes (Signed)
Patient ID: Sherry Peck, female   DOB: Jan 04, 1928, 81 y.o.   MRN: [id number]      Reason for office visit atrial fibrillation, HTN, chronic diastolic heart failure  Sherry Peck is doing well, without complaints of edema, syncope, neurological deficits, bleeding or worsening dyspnea (baseline NYHA class II). She and her daughter had to postpone their trip to [place], rescheduling for April.  She has permanent atrial fibrillation, moderate MR, severely dilated left atrium, likely on the background of hypertensive heart disease, also well-compensated HFPEF without need for chronic diuretic therapy. No history of CVA/TIA or known CAD.   Allergies  Allergen Reactions  . Ace Inhibitors     REACTION: cough(2002)    Current Outpatient Prescriptions  Medication Sig Dispense Refill  . acetaminophen (TYLENOL) 500 MG tablet Take 1,000 mg by mouth 2 (two) times daily as needed for pain.    Marland Kitchen. atenolol (TENORMIN) 25 MG tablet Take 0.5 tablets (12.5 mg total) by mouth daily. 45 tablet 3  . Calcium Carbonate-Vitamin D (CALCIUM 600/VITAMIN D PO) Take 2 tablets by mouth daily.    . Coenzyme Q10 (CO Q 10) 100 MG CAPS Take 100 mg by mouth daily.    Marland Kitchen. diltiazem (TIAZAC) 360 MG 24 hr capsule Take 1 capsule (360 mg total) by mouth daily. 90 capsule 3  . fish oil-omega-3 fatty acids 1000 MG capsule Take 2,000 mg by mouth daily.    . irbesartan (AVAPRO) 300 MG tablet Take 1 tablet (300 mg total) by mouth daily. 90 tablet 3  . Rivaroxaban (XARELTO) 15 MG TABS tablet Take 1 tablet (15 mg total) by mouth daily with supper. 90 tablet 3   No current facility-administered medications for this visit.    Past Medical History  Diagnosis Date  . Hypertension   . 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 Cataract     right eye  . Dysrhythmia   . Chronic a-fib     sees Dr Royann Shiversroitoru @ [facility]  . Cardiomyopathy due to hypertension     stable  . CHF (congestive heart failure)   . Diastolic heart failure    . Moderate mitral insufficiency   . Hx of echocardiogram 05/01/2011    EF 50-55% borderline left ventricular systolic function and moderate to serverely dilated left atrium.  . Arthritis     Past Surgical History  Procedure Laterality Date  . No past surgeries    . Eye surgery    . Cataract extraction Left     in [place]  . Cataract extraction w/phaco Right 08/14/2012    Procedure: CATARACT EXTRACTION PHACO AND INTRAOCULAR LENS PLACEMENT (IOC);  Surgeon: Chalmers Guestoy Whitaker, MD;  [facility];  Service: Ophthalmology;  Laterality: Right;    No family history on file.  History   Social History  . Marital Status: Widowed    Spouse Name: N/A    Number of Children: N/A  . Years of Education: N/A   Occupational History  . Not on file.   Social History Main Topics  . Smoking status: Never Smoker   . Smokeless tobacco: Never Used  . Alcohol Use: No  . Drug Use: No  . Sexual Activity: No   Other Topics Concern  . Not on file   Social History Narrative    Review of systems: The patient specifically denies any chest pain at rest or with exertion, dyspnea at rest or with usual exertion, orthopnea, paroxysmal nocturnal dyspnea, syncope, palpitations, focal  neurological deficits, intermittent claudication, lower extremity edema, unexplained weight gain, cough, hemoptysis or wheezing.  The patient also denies abdominal pain, nausea, vomiting, dysphagia, diarrhea, constipation, polyuria, polydipsia, dysuria, hematuria, frequency, urgency, abnormal bleeding or bruising, fever, chills, unexpected weight changes, mood swings, change in skin or hair texture, change in voice quality, auditory or visual problems, allergic reactions or rashes, new musculoskeletal complaints other than usual "aches and pains".   PHYSICAL EXAM BP 150/62 mmHg  Pulse 58  Ht 5' (1.524 m)  Wt 138 lb 6.4 oz (62.778 kg)  BMI 27.03 kg/m2  General: Alert, oriented x3, no distress Head: no evidence of trauma,  PERRL, EOMI, no exophtalmos or lid lag, no myxedema, no xanthelasma; normal ears, nose and oropharynx Neck: normal jugular venous pulsations and no hepatojugular reflux; brisk carotid pulses without delay and no carotid bruits Chest: clear to auscultation, no signs of consolidation by percussion or palpation, normal fremitus, symmetrical and full respiratory excursions Cardiovascular: normal position and quality of the apical impulse, irregular rhythm, normal first and second heart sounds, no murmurs, rubs or gallops Abdomen: no tenderness or distention, no masses by palpation, no abnormal pulsatility or arterial bruits, normal bowel sounds, no hepatosplenomegaly Extremities: no clubbing, cyanosis or edema; 2+ radial, ulnar and brachial pulses bilaterally; 2+ right femoral, posterior tibial and dorsalis pedis pulses; 2+ left femoral, posterior tibial and dorsalis pedis pulses; no subclavian or femoral bruits Neurological: grossly nonfocal  EKG: atrial fibrillation, LVH  Lipid Panel     Component Value Date/Time   CHOL 174 08/11/2008 2149   TRIG 81 08/11/2008 2149   HDL 63 08/11/2008 2149   CHOLHDL 2.8 Ratio 08/11/2008 2149   VLDL 16 08/11/2008 2149   LDLCALC 95 08/11/2008 2149    BMET    Component Value Date/Time   NA 137 03/26/2014 1814   K 3.6 03/26/2014 1814   CL 97 03/26/2014 1814   CO2 29 03/26/2014 1814   GLUCOSE 113* 03/26/2014 1814   BUN 26* 03/26/2014 1814   CREATININE 1.48* 03/26/2014 1814   CREATININE 1.45* 08/13/2012 1600   CALCIUM 8.8 03/26/2014 1814   GFRNONAA 33* 08/13/2012 1600   GFRAA 38* 08/13/2012 1600     ASSESSMENT AND PLAN Atrial fibrillation Permanent arrhythmia, well rate controlled on combination of calcium channel blocker and beta blocker. Since she is planning an extended trip to [place], where she will be unable to get routine prothrombin time monitoring, I recommended that stay on a novel anticoagulant.   Chronic diastolic heart failure By  clinical criteria she appears to be euvolemic. NYHA functional class II. She is on a RAAS inhibitor and beta blockers in addition to diltiazem which is necessary for rate control.  Orders Placed This Encounter  Procedures  . EKG 12-Lead   Meds ordered this encounter  Medications  . DISCONTD: irbesartan (AVAPRO) 300 MG tablet    Sig: Take 300 mg by mouth daily.  . Rivaroxaban (XARELTO) 15 MG TABS tablet    Sig: Take 1 tablet (15 mg total) by mouth daily with supper.    Dispense:  90 tablet    Refill:  3  . irbesartan (AVAPRO) 300 MG tablet    Sig: Take 1 tablet (300 mg total) by mouth daily.    Dispense:  90 tablet    Refill:  3  . diltiazem (TIAZAC) 360 MG 24 hr capsule    Sig: Take 1 capsule (360 mg total) by mouth daily.    Dispense:  90 capsule    Refill:  3  . atenolol (TENORMIN) 25 MG tablet    Sig: Take 0.5 tablets (12.5 mg total) by mouth daily.    Dispense:  45 tablet    Refill:  3    Ivon Roedel  Thurmon Fair, MD, [facility] [phone] office [phone] pager

## 2014-08-28 ENCOUNTER — Ambulatory Visit (INDEPENDENT_AMBULATORY_CARE_PROVIDER_SITE_OTHER): Payer: Medicare Other | Admitting: Family Medicine

## 2014-08-28 VITALS — BP 112/62 | HR 71 | Temp 98.0°F | Resp 16 | Ht 60.0 in | Wt 135.0 lb

## 2014-08-28 DIAGNOSIS — Z23 Encounter for immunization: Secondary | ICD-10-CM

## 2014-08-28 DIAGNOSIS — Z283 Underimmunization status: Secondary | ICD-10-CM

## 2014-08-28 DIAGNOSIS — R112 Nausea with vomiting, unspecified: Secondary | ICD-10-CM

## 2014-08-28 DIAGNOSIS — Z2839 Other underimmunization status: Secondary | ICD-10-CM

## 2014-08-28 DIAGNOSIS — Z7189 Other specified counseling: Secondary | ICD-10-CM

## 2014-08-28 DIAGNOSIS — R197 Diarrhea, unspecified: Secondary | ICD-10-CM

## 2014-08-28 DIAGNOSIS — Z7184 Encounter for health counseling related to travel: Secondary | ICD-10-CM

## 2014-08-28 MED ORDER — TYPHOID VACCINE PO CPDR: 1.0000 | DELAYED_RELEASE_CAPSULE | ORAL | Status: DC

## 2014-08-28 MED ORDER — ONDANSETRON 4 MG PO TBDP: 4.0000 mg | ORAL_TABLET | Freq: Three times a day (TID) | ORAL | Status: DC | PRN

## 2014-08-28 MED ORDER — CIPROFLOXACIN HCL 250 MG PO TABS: ORAL_TABLET | ORAL | Status: DC

## 2014-08-28 MED ORDER — ATOVAQUONE-PROGUANIL HCL 250-100 MG PO TABS: 1.0000 | ORAL_TABLET | Freq: Every day | ORAL | Status: DC

## 2014-08-28 NOTE — Patient Instructions (Addendum)
Take the typhoid medication one pill every other day for 4 pills. Please read the instructions carefully. This needs to be refrigerated. Do not take with hot drinks. This may cause some abdominal pain and diarrhea.  Take Malarone for prevention of malaria beginning 1 day before entering [place], and continue until for 7 days after leaving country.  You will be given a prescription for ciprofloxacin. If you get traveler's diarrhea begin taking this immediately when you get the diarrhea and take one pill twice daily for about 3 days. Sometimes this can cause confusion in the elderly. Discontinue if problems.   Advise taking some Imodium with you for diarrhea  Take ondansetron if needed for nausea or vomiting  Always be careful to drink clean water. Try to always pill fruits or vegetables. It is best to eat things that are cooked rather than fresh.  Go to the following website and type in the country of [place] and read the information as it is very educational and helpful. BrokenLung.itHttp://wwwnc.cdc.gov/travel/destinations/list  You will need to get yellow fever vaccination and have the international yellow fever card to carry with you in order to get into the country. Contact [facility] [phone] to arrange this shot. It is also available at the health department and you can contact [facility]. This is very important. You cannot get into the country without it.

## 2014-08-28 NOTE — Progress Notes (Signed)
Subjective: 81 year old lady who is here for a review of her travel medicine needs. She is getting ready to go back to visit [place] after 11 years in the [place]. She is going in May. She has a history of heart disease and sees a cardiologist. No acute problems today. Patient speaks very little [place], but her daughter who was with her speaks well. Her daughter will be traveling with her. They are leaving in May.  Objective: Chest clear. Heart irregular without murmur.  Assessment: Travel medicine Immunization deficiency Thomas diarrhea prophylaxis Malaria prophylaxis  Plan: Had about a 45 minute discussion with her and her daughter. [facility] the [facility]. Typhoid oral vaccine Malarone Cipro for prevention of diarrhea Ondansetron in case of nausea and vomiting Immunizations for hepatitis A, hepatitis B, meningitis, and tetanus Advised her to take plenty of her medicines to last her the 3 months she has in [place].

## 2014-09-11 ENCOUNTER — Encounter: Payer: Medicare Other | Admitting: Internal Medicine

## 2014-09-18 ENCOUNTER — Other Ambulatory Visit: Payer: Self-pay | Admitting: Family Medicine

## 2014-09-21 NOTE — Telephone Encounter (Signed)
Do you want to give RF? 

## 2014-09-29 ENCOUNTER — Telehealth: Payer: Self-pay | Admitting: Cardiovascular Disease

## 2014-09-29 NOTE — Telephone Encounter (Signed)
Pt of Dr. Royann Shiversroitoru - was at eye appt today, they noted on VS check her HR was between 30-40.  Daughter states no apparent symptoms w this w/e of general fatigue; pt "walks a little bit, gets tired". Unknown how recent this issue started.  Reports normal BPs (140s/60s-70s), compliant w/ current meds.  Dr. Royann Shiversroitoru out of office, will route to [employer] to advise.

## 2014-09-29 NOTE — Telephone Encounter (Signed)
Left message on machine requesting patient to return call.

## 2014-09-29 NOTE — Telephone Encounter (Signed)
Sherry Peck ( daughter) is calling because her mother went to the eye doctor at [facility] and they told her to let her cardiologist know ( Dr. Royann Shiversroitoru) that her heart rate is between 30-40. Please call .   Thanks

## 2014-09-29 NOTE — Telephone Encounter (Signed)
I would stop tenormin   hold next cardizem dose.  Will need OV to reassess HR control.   If she has a BP monitor at home can check HR and keep a diary.  Peter SwazilandJordan MD, [facility]

## 2014-09-29 NOTE — Telephone Encounter (Signed)
Please make office appointment ASAP. APP ok.

## 2014-10-02 NOTE — Telephone Encounter (Signed)
Will forward to admin pool for scheduling

## 2014-10-08 ENCOUNTER — Ambulatory Visit (INDEPENDENT_AMBULATORY_CARE_PROVIDER_SITE_OTHER): Payer: Medicare Other | Admitting: Nurse Practitioner

## 2014-10-08 ENCOUNTER — Ambulatory Visit (INDEPENDENT_AMBULATORY_CARE_PROVIDER_SITE_OTHER): Payer: Medicare Other

## 2014-10-08 ENCOUNTER — Encounter: Payer: Self-pay | Admitting: Nurse Practitioner

## 2014-10-08 VITALS — BP 115/54 | HR 79 | Ht 60.0 in | Wt 134.0 lb

## 2014-10-08 DIAGNOSIS — I10 Essential (primary) hypertension: Secondary | ICD-10-CM | POA: Diagnosis not present

## 2014-10-08 DIAGNOSIS — I482 Chronic atrial fibrillation, unspecified: Secondary | ICD-10-CM

## 2014-10-08 DIAGNOSIS — I340 Nonrheumatic mitral (valve) insufficiency: Secondary | ICD-10-CM

## 2014-10-08 DIAGNOSIS — I5032 Chronic diastolic (congestive) heart failure: Secondary | ICD-10-CM

## 2014-10-08 NOTE — Progress Notes (Signed)
Patient Name: Sherry Peck Date of Encounter: 10/08/2014  Primary Care Provider:  Lucilla EdinAUB, STEVE A, MD Primary Cardiologist:  Judie PetitM. Croitoru, MD   Chief Complaint  81 year old female who presents to for evaluation secondary to recent reported bradycardia.  Past Medical History   Past Medical History  Diagnosis Date  . Hypertension   . 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 Cataract     right eye  . Chronic a-fib     a. CHA2DS2VASc = 5-->Xarelto.  . Chronic diastolic CHF (congestive heart failure)     a. 04/2011 Echo: EF 50-55%, impaired LV relaxation, mod-sev dil LA, mod MR/TR/PR, PASP 30-7040mmHg.  . Moderate mitral insufficiency     a. 04/2011 Echo: Mod MR.  . Arthritis   . Moderate tricuspid insufficiency    Past Surgical History  Procedure Laterality Date  . No past surgeries    . Eye surgery    . Cataract extraction Left     in [place]  . Cataract extraction w/phaco Right 08/14/2012    Procedure: CATARACT EXTRACTION PHACO AND INTRAOCULAR LENS PLACEMENT (IOC);  Surgeon: Chalmers Guestoy Whitaker, MD;  [facility];  Service: Ophthalmology;  Laterality: Right;    Allergies  Allergies  Allergen Reactions  . Ace Inhibitors Cough    REACTION: cough(2002)    HPI  81 year old female with the above complex problem list. She has permanent atrial fibrillation and is historically well rate controlled. She has been on low-dose atenolol and also diltiazem while being anticoagulated with Xarelto. She was last seen in clinic in February by Dr. Royann Shiversroitoru and was doing well @ that time.  She is preparing to travel to [place] over the summer and may not return to the states.  She has not had any chest pain, sob, pnd, orthopnea, n, v, dizziness, syncope, edema, or early satiety.  She is relatively sedentary @ home.  On 4/26, she was having a procedure on her left eye and was noted to be bradycardic with rates reportedly in the 30's to 40's.  The office was contacted and recommendation was made for pt  to hold their atenolol and hold one dose of diltiazem.  Family says that this was never communicated to them and as a result, she has remained on atenolol 12.5 mg daily along with dilt cd 360 mg daily.  As above, she has remained asymptomatic.  HR today is 79.  Home Medications  Prior to Admission medications   Medication Sig Start Date End Date Taking? Authorizing Provider  acetaminophen (TYLENOL) 500 MG tablet Take 1,000 mg by mouth 2 (two) times daily as needed for pain.   Yes Historical Provider, MD  atenolol (TENORMIN) 25 MG tablet Take 12.5 mg by mouth daily. Patient taking one half tablet by mouth (12.5 mg ) daily   Yes Historical Provider, MD  Calcium Carbonate-Vitamin D (CALCIUM 600/VITAMIN D PO) Take 2 tablets by mouth daily.   Yes Historical Provider, MD  Coenzyme Q10 (CO Q 10) 100 MG CAPS Take 100 mg by mouth daily.   Yes Historical Provider, MD  diltiazem (TIAZAC) 360 MG 24 hr capsule Take 1 capsule (360 mg total) by mouth daily. 07/08/14  Yes Mihai Croitoru, MD  fish oil-omega-3 fatty acids 1000 MG capsule Take 2,000 mg by mouth daily.   Yes Historical Provider, MD  ondansetron (ZOFRAN-ODT) 4 MG disintegrating tablet TAKE 1 TABLET BY MOUTH EVERY 8 HOURS AS NEEDED FOR NAUSEA OR VOMITING 09/23/14  Yes Peyton Najjaravid H Hopper, MD  Rivaroxaban (XARELTO) 15 MG TABS tablet Take 1 tablet (15 mg total) by mouth daily with supper. 07/08/14  Yes Mihai Croitoru, MD  irbesartan (AVAPRO) 300 MG tablet Take 1 tablet (300 mg total) by mouth daily. Patient not taking: Reported on 10/08/2014 07/08/14   Mihai Croitoru, MD  MALARONE 250-100 MG TABS Take 1 tablet by mouth daily.  09/02/14   Historical Provider, MD  typhoid (VIVOTIF) DR capsule Take 1 capsule by mouth every other day. X 4 doses. Patient not taking: Reported on 10/08/2014 08/28/14   Peyton Najjaravid H Hopper, MD    Review of Systems  Doing well as above.  She denies chest pain, palpitations, dyspnea, pnd, orthopnea, n, v, dizziness, syncope, edema, weight gain, or  early satiety.  All other systems reviewed and are otherwise negative except as noted above.  Physical Exam  VS:  BP 115/54 mmHg  Pulse 79  Ht 5' (1.524 m)  Wt 134 lb (60.782 kg)  BMI 26.17 kg/m2 , BMI Body mass index is 26.17 kg/(m^2). GEN: Well nourished, well developed, in no acute distress. HEENT: normal. Neck: Supple, no JVD, carotid bruits, or masses. Cardiac: IR, IR, no murmurs, rubs, or gallops. No clubbing, cyanosis, edema.  Radials/DP/PT 2+ and equal bilaterally.  Respiratory:  Respirations regular and unlabored, clear to auscultation bilaterally. GI: Soft, nontender, nondistended, BS + x 4. MS: no deformity or atrophy. Skin: warm and dry, no rash. Neuro:  Strength and sensation are intact. Psych: Normal affect.  Accessory Clinical Findings  ECG - afib, 81, lvh with inf st/t changes - no acute changes.  Assessment & Plan  1.  Permanent Atrial fibrillation:  We were recently notified that pt was experiencing bradycardia during eye surgery.  She was supposed to have d/c'd her atenolol.  Family says that this was never communicated to them and so she has continued on both atenolol and diltiazem.  HR today is 79.  I will place a 48 holter monitor to assess her heart rates throughout the day.  Following this, we can decide what we'd like to do with her bb/dilt.  She is planning on traveling to [place] later this month and her family says that there is a pretty good chance that she may not return to the states.  She is chronically anticoagulated with xarelto.  2.  Essential HTN:  Stable on bb/dilt/arb.  3.  Chronic Diastolic CHF:  euvolemic on exam.  HR/BP well-controlled.  4.  Mod MR:  Conservative mgmt.  5.  Dispo:  F/u holter monitoring.   Nicolasa Duckinghristopher Sole Lengacher, NP 10/08/2014, 4:35 PM

## 2014-10-08 NOTE — Patient Instructions (Signed)
Medication Instructions:  Your physician recommends that you continue on your current medications as directed. Please refer to the Current Medication list given to you today.   Labwork:   Testing/Procedures: Your physician has recommended that you wear a holter monitor.ASAP BEFORE [place] TRIP  10/17/14  Holter monitors are medical devices that record the heart's electrical activity. Doctors most often use these monitors to diagnose arrhythmias. Arrhythmias are problems with the speed or rhythm of the heartbeat. The monitor is a small, portable device. You can wear one while you do your normal daily activities. This is usually used to diagnose what is causing palpitations/syncope (passing out).    Follow-Up:  FOLLOW UP AS NEEDED IF YOU NEED ANY HELP WITH CARDIAC ISSUES THE OFFICE   Any Other Special Instructions Will Be Listed Below (If Applicable).

## 2014-10-09 ENCOUNTER — Ambulatory Visit (INDEPENDENT_AMBULATORY_CARE_PROVIDER_SITE_OTHER): Payer: Self-pay | Admitting: Internal Medicine

## 2014-10-09 DIAGNOSIS — Z7184 Encounter for health counseling related to travel: Secondary | ICD-10-CM

## 2014-10-09 DIAGNOSIS — Z7189 Other specified counseling: Secondary | ICD-10-CM

## 2014-10-09 MED ORDER — TYPHOID VACCINE PO CPDR: 1.0000 | DELAYED_RELEASE_CAPSULE | ORAL | Status: AC

## 2014-10-09 NOTE — Progress Notes (Signed)
   Subjective:    Patient ID: Sherry RushingAdwoa Perras, female    DOB: December 21, 1927, 81 y.o.   MRN: [id number]  HPI 81yo F going to back to [place] with her daughter. She tentatively plans to stay for 3 months, but maybe longer. She has gone to urgent care to receive hep A, hep B, meningitis, Tdap. She also was given rx for vivotif, cipro, and malarone for malaria proph.  She has received flu and pneumococcal vaccination  Current Outpatient Prescriptions on File Prior to Visit  Medication Sig Dispense Refill  . acetaminophen (TYLENOL) 500 MG tablet Take 1,000 mg by mouth 2 (two) times daily as needed for pain.    Marland Kitchen. atenolol (TENORMIN) 25 MG tablet Take 12.5 mg by mouth daily. Patient taking one half tablet by mouth (12.5 mg ) daily    . Calcium Carbonate-Vitamin D (CALCIUM 600/VITAMIN D PO) Take 2 tablets by mouth daily.    . Coenzyme Q10 (CO Q 10) 100 MG CAPS Take 100 mg by mouth daily.    Marland Kitchen. diltiazem (TIAZAC) 360 MG 24 hr capsule Take 1 capsule (360 mg total) by mouth daily. 90 capsule 3  . fish oil-omega-3 fatty acids 1000 MG capsule Take 2,000 mg by mouth daily.    . irbesartan (AVAPRO) 300 MG tablet Take 1 tablet (300 mg total) by mouth daily. (Patient not taking: Reported on 10/08/2014) 90 tablet 3  . MALARONE 250-100 MG TABS Take 1 tablet by mouth daily.     . ondansetron (ZOFRAN-ODT) 4 MG disintegrating tablet TAKE 1 TABLET BY MOUTH EVERY 8 HOURS AS NEEDED FOR NAUSEA OR VOMITING 30 tablet 0  . Rivaroxaban (XARELTO) 15 MG TABS tablet Take 1 tablet (15 mg total) by mouth daily with supper. 90 tablet 3  . typhoid (VIVOTIF) DR capsule Take 1 capsule by mouth every other day. X 4 doses. (Patient not taking: Reported on 10/08/2014) 4 capsule 0   No current facility-administered medications on file prior to visit.   Active Ambulatory Problems    Diagnosis Date Noted  . HYPERLIPIDEMIA 01/22/2007  . HYPERTENSION 01/22/2007  . HEMORRHOIDS 01/22/2007  . DEGENERATIVE JOINT DISEASE, GENERALIZED 11/18/2007  . LEG  PAIN, LEFT 06/08/2008  . OSTEOPENIA 01/22/2007  . HEADACHE 06/08/2008  . ABNRM FINDINGS, TB SKIN TEST W/O ACTIVE TB 11/24/2005  . Atrial fibrillation 08/21/2012  . Long term (current) use of anticoagulants 08/21/2012  . Chronic diastolic heart failure 11/16/2012  . Hypertensive heart disease 11/16/2012  . Chronic a-fib   . Chronic diastolic CHF (congestive heart failure)   . Moderate mitral insufficiency    Resolved Ambulatory Problems    Diagnosis Date Noted  . No Resolved Ambulatory Problems   Past Medical History  Diagnosis Date  . Hypertension   . 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 Cataract   . Arthritis   . Moderate tricuspid insufficiency       Review of Systems     Objective:   Physical Exam        Assessment & Plan:  Pre-travel advice given. Yellow fever is endemic to [place]. Due to her advanced age and other co-morbidities, I have recommended that she not receive the yellow fever vaccination due to higher risk for adverse side effects from the vaccination  Recommend to start oral typhoid vaccination today  Malaria prophylaxis = to start malarone 2 days prior to leaving for [place].

## 2014-11-13 DIAGNOSIS — R32 Unspecified urinary incontinence: Secondary | ICD-10-CM | POA: Diagnosis not present

## 2014-11-13 DIAGNOSIS — I10 Essential (primary) hypertension: Secondary | ICD-10-CM | POA: Diagnosis not present

## 2014-11-13 DIAGNOSIS — I509 Heart failure, unspecified: Secondary | ICD-10-CM | POA: Diagnosis not present

## 2015-03-09 ENCOUNTER — Encounter: Payer: Self-pay | Admitting: Emergency Medicine
